# Patient Record
Sex: Male | Born: 1965 | Race: White | Hispanic: No | Marital: Single | Smoking: Former smoker
Health system: Southern US, Community
[De-identification: ages and names within clinical notes are randomized; demographics above are authoritative.]

## PROBLEM LIST (undated history)

## (undated) DIAGNOSIS — J302 Other seasonal allergic rhinitis: Principal | ICD-10-CM

## (undated) DIAGNOSIS — L409 Psoriasis, unspecified: Secondary | ICD-10-CM

## (undated) DIAGNOSIS — I10 Essential (primary) hypertension: Principal | ICD-10-CM

## (undated) DIAGNOSIS — K859 Acute pancreatitis without necrosis or infection, unspecified: Secondary | ICD-10-CM

## (undated) HISTORY — PX: ERCP: SHX60

## (undated) HISTORY — DX: Other seasonal allergic rhinitis: J30.2

## (undated) HISTORY — DX: Essential (primary) hypertension: I10

## (undated) HISTORY — DX: Acute pancreatitis without necrosis or infection, unspecified: K85.90

## (undated) HISTORY — DX: Psoriasis, unspecified: L40.9

---

## 2001-04-10 ENCOUNTER — Encounter (INDEPENDENT_AMBULATORY_CARE_PROVIDER_SITE_OTHER): Payer: Self-pay | Admitting: Specialist

## 2001-04-10 ENCOUNTER — Other Ambulatory Visit: Admission: RE | Admit: 2001-04-10 | Discharge: 2001-04-10 | Payer: Self-pay | Admitting: Urology

## 2002-06-13 ENCOUNTER — Encounter: Payer: Self-pay | Admitting: Family Medicine

## 2002-06-13 ENCOUNTER — Ambulatory Visit (HOSPITAL_COMMUNITY): Admission: RE | Admit: 2002-06-13 | Discharge: 2002-06-13 | Payer: Self-pay | Admitting: Family Medicine

## 2010-08-25 ENCOUNTER — Encounter: Payer: Self-pay | Admitting: Family Medicine

## 2010-08-25 ENCOUNTER — Ambulatory Visit
Admission: RE | Admit: 2010-08-25 | Discharge: 2010-08-25 | Payer: Self-pay | Source: Home / Self Care | Attending: Family Medicine | Admitting: Family Medicine

## 2010-08-25 ENCOUNTER — Other Ambulatory Visit: Payer: Self-pay | Admitting: Family Medicine

## 2010-08-25 DIAGNOSIS — R1011 Right upper quadrant pain: Secondary | ICD-10-CM | POA: Insufficient documentation

## 2010-08-25 DIAGNOSIS — J302 Other seasonal allergic rhinitis: Secondary | ICD-10-CM

## 2010-08-25 DIAGNOSIS — J309 Allergic rhinitis, unspecified: Secondary | ICD-10-CM | POA: Insufficient documentation

## 2010-08-25 HISTORY — DX: Other seasonal allergic rhinitis: J30.2

## 2010-08-25 LAB — CBC WITH DIFFERENTIAL/PLATELET
Basophils Absolute: 0 10*3/uL (ref 0.0–0.1)
Basophils Relative: 0.2 % (ref 0.0–3.0)
Eosinophils Absolute: 0.2 10*3/uL (ref 0.0–0.7)
Eosinophils Relative: 2.2 % (ref 0.0–5.0)
HCT: 43.5 % (ref 39.0–52.0)
Hemoglobin: 14.5 g/dL (ref 13.0–17.0)
Lymphocytes Relative: 6.7 % — ABNORMAL LOW (ref 12.0–46.0)
Lymphs Abs: 0.7 10*3/uL (ref 0.7–4.0)
MCHC: 33.4 g/dL (ref 30.0–36.0)
MCV: 92.8 fl (ref 78.0–100.0)
Monocytes Absolute: 1.1 10*3/uL — ABNORMAL HIGH (ref 0.1–1.0)
Monocytes Relative: 10 % (ref 3.0–12.0)
Neutro Abs: 8.9 10*3/uL — ABNORMAL HIGH (ref 1.4–7.7)
Neutrophils Relative %: 80.9 % — ABNORMAL HIGH (ref 43.0–77.0)
Platelets: 801 10*3/uL — ABNORMAL HIGH (ref 150.0–400.0)
RBC: 4.69 Mil/uL (ref 4.22–5.81)
RDW: 18.2 % — ABNORMAL HIGH (ref 11.5–14.6)
WBC: 11 10*3/uL — ABNORMAL HIGH (ref 4.5–10.5)

## 2010-08-25 LAB — BASIC METABOLIC PANEL
BUN: 20 mg/dL (ref 6–23)
CO2: 36 mEq/L — ABNORMAL HIGH (ref 19–32)
Calcium: 9.7 mg/dL (ref 8.4–10.5)
Chloride: 91 mEq/L — ABNORMAL LOW (ref 96–112)
Creatinine, Ser: 1.3 mg/dL (ref 0.4–1.5)
GFR: 65.23 mL/min (ref >60.00)
Glucose, Bld: 103 mg/dL — ABNORMAL HIGH (ref 70–99)
Potassium: 4.8 mEq/L (ref 3.5–5.1)
Sodium: 139 mEq/L (ref 135–145)

## 2010-08-25 LAB — LIPID PANEL
Cholesterol: 95 mg/dL (ref 0–200)
HDL: 30.3 mg/dL — ABNORMAL LOW (ref >39.00)
LDL Cholesterol: 49 mg/dL (ref 0–99)
Total CHOL/HDL Ratio: 3
Triglycerides: 79 mg/dL (ref 0.0–149.0)
VLDL: 15.8 mg/dL (ref 0.0–40.0)

## 2010-08-25 LAB — PSA: PSA: 1.3 ng/mL (ref 0.10–4.00)

## 2010-08-25 LAB — CONVERTED CEMR LAB
Nitrite: NEGATIVE
Specific Gravity, Urine: 1.02
pH: 6.5

## 2010-08-25 LAB — TSH: TSH: 1.46 u[IU]/mL (ref 0.35–5.50)

## 2010-08-25 LAB — HEPATIC FUNCTION PANEL
ALT: 10 U/L (ref 0–53)
AST: 16 U/L (ref 0–37)
Albumin: 4 g/dL (ref 3.5–5.2)
Alkaline Phosphatase: 55 U/L (ref 39–117)
Bilirubin, Direct: 0.1 mg/dL (ref 0.0–0.3)
Total Bilirubin: 0.3 mg/dL (ref 0.3–1.2)
Total Protein: 7.5 g/dL (ref 6.0–8.3)

## 2010-08-25 LAB — LIPASE: Lipase: 145 U/L — ABNORMAL HIGH (ref 11.0–59.0)

## 2010-08-25 LAB — AMYLASE: Amylase: 185 U/L — ABNORMAL HIGH (ref 27–131)

## 2010-08-26 ENCOUNTER — Telehealth: Payer: Self-pay | Admitting: Family Medicine

## 2010-08-26 NOTE — Telephone Encounter (Signed)
Pt stated that Dr Tawanna Cooler called last night and is returning his call.

## 2010-08-27 ENCOUNTER — Ambulatory Visit: Admission: RE | Admit: 2010-08-27 | Payer: BC Managed Care – PPO | Source: Ambulatory Visit

## 2010-08-27 ENCOUNTER — Other Ambulatory Visit: Payer: Self-pay | Admitting: Family Medicine

## 2010-08-27 ENCOUNTER — Inpatient Hospital Stay
Admission: RE | Admit: 2010-08-27 | Discharge: 2010-08-27 | Disposition: A | Discharge status: Home / Self Care | Payer: BC Managed Care – PPO | Source: Ambulatory Visit | Attending: Family Medicine | Admitting: Family Medicine

## 2010-08-27 ENCOUNTER — Inpatient Hospital Stay (HOSPITAL_COMMUNITY)
Admission: AD | Admit: 2010-08-27 | Discharge: 2010-08-29 | DRG: 204 | Disposition: A | Discharge status: Home / Self Care | Payer: BC Managed Care – PPO | Source: Ambulatory Visit | Attending: Internal Medicine | Admitting: Internal Medicine

## 2010-08-27 ENCOUNTER — Encounter: Payer: Self-pay | Admitting: Gastroenterology

## 2010-08-27 ENCOUNTER — Ambulatory Visit
Admission: RE | Admit: 2010-08-27 | Discharge: 2010-08-27 | Disposition: A | Discharge status: Home / Self Care | Payer: BC Managed Care – PPO | Source: Ambulatory Visit | Attending: Family Medicine | Admitting: Family Medicine

## 2010-08-27 DIAGNOSIS — K8050 Calculus of bile duct without cholangitis or cholecystitis without obstruction: Secondary | ICD-10-CM | POA: Diagnosis present

## 2010-08-27 DIAGNOSIS — R1011 Right upper quadrant pain: Secondary | ICD-10-CM

## 2010-08-27 DIAGNOSIS — R933 Abnormal findings on diagnostic imaging of other parts of digestive tract: Secondary | ICD-10-CM

## 2010-08-27 DIAGNOSIS — K862 Cyst of pancreas: Secondary | ICD-10-CM

## 2010-08-27 DIAGNOSIS — K859 Acute pancreatitis without necrosis or infection, unspecified: Principal | ICD-10-CM | POA: Diagnosis present

## 2010-08-27 LAB — MAGNESIUM: Magnesium: 2.6 mg/dL — ABNORMAL HIGH (ref 1.5–2.5)

## 2010-08-27 LAB — APTT: aPTT: 37 seconds (ref 24–37)

## 2010-08-27 LAB — URINE MICROSCOPIC-ADD ON

## 2010-08-27 LAB — LACTATE DEHYDROGENASE: LDH: 110 U/L (ref 94–250)

## 2010-08-27 LAB — URINALYSIS, ROUTINE W REFLEX MICROSCOPIC
Urine Glucose, Fasting: NEGATIVE mg/dL
pH: 5.5 (ref 5.0–8.0)

## 2010-08-27 MED ORDER — IOHEXOL 300 MG/ML  SOLN: 100.0000 mL | Freq: Once | INTRAMUSCULAR | Status: DC | PRN

## 2010-08-28 ENCOUNTER — Other Ambulatory Visit: Payer: Self-pay | Admitting: Gastroenterology

## 2010-08-28 ENCOUNTER — Ambulatory Visit: Payer: Self-pay | Admitting: Family Medicine

## 2010-08-28 ENCOUNTER — Inpatient Hospital Stay (HOSPITAL_COMMUNITY): Payer: BC Managed Care – PPO

## 2010-08-28 DIAGNOSIS — K831 Obstruction of bile duct: Secondary | ICD-10-CM

## 2010-08-28 LAB — DIFFERENTIAL
Lymphocytes Relative: 4 % — ABNORMAL LOW (ref 12–46)
Lymphs Abs: 0.6 10*3/uL — ABNORMAL LOW (ref 0.7–4.0)
Monocytes Relative: 13 % — ABNORMAL HIGH (ref 3–12)
Neutro Abs: 10.8 10*3/uL — ABNORMAL HIGH (ref 1.7–7.7)
Neutrophils Relative %: 82 % — ABNORMAL HIGH (ref 43–77)

## 2010-08-28 LAB — COMPREHENSIVE METABOLIC PANEL
AST: 13 U/L (ref 0–37)
Albumin: 2.9 g/dL — ABNORMAL LOW (ref 3.5–5.2)
Alkaline Phosphatase: 40 U/L (ref 39–117)
BUN: 22 mg/dL (ref 6–23)
CO2: 30 mEq/L (ref 19–32)
Chloride: 92 mEq/L — ABNORMAL LOW (ref 96–112)
Creatinine, Ser: 1.27 mg/dL (ref 0.4–1.5)
GFR calc non Af Amer: >60 mL/min (ref >60)
Potassium: 3.5 mEq/L (ref 3.5–5.1)
Total Bilirubin: 1 mg/dL (ref 0.3–1.2)

## 2010-08-28 LAB — CBC
Hemoglobin: 11.5 g/dL — ABNORMAL LOW (ref 13.0–17.0)
MCH: 29.7 pg (ref 26.0–34.0)
MCV: 89.1 fL (ref 78.0–100.0)
RBC: 3.87 MIL/uL — ABNORMAL LOW (ref 4.22–5.81)
WBC: 13.2 10*3/uL — ABNORMAL HIGH (ref 4.0–10.5)

## 2010-08-28 LAB — CANCER ANTIGEN 19-9: CA 19-9: 34.3 U/mL — ABNORMAL LOW (ref <35.0)

## 2010-08-31 ENCOUNTER — Telehealth: Payer: Self-pay | Admitting: Gastroenterology

## 2010-09-01 ENCOUNTER — Encounter (INDEPENDENT_AMBULATORY_CARE_PROVIDER_SITE_OTHER): Payer: Self-pay | Admitting: *Deleted

## 2010-09-01 ENCOUNTER — Telehealth (INDEPENDENT_AMBULATORY_CARE_PROVIDER_SITE_OTHER): Payer: Self-pay | Admitting: *Deleted

## 2010-09-01 DIAGNOSIS — R933 Abnormal findings on diagnostic imaging of other parts of digestive tract: Secondary | ICD-10-CM | POA: Insufficient documentation

## 2010-09-02 NOTE — Procedures (Signed)
Summary: ERCP  Patient: James Giles Note: All result statuses are Final unless otherwise noted.  Tests: (1) ERCP (ERC)   ERC ERCP                  DONE     [facility]     [address] [place], [place]  [address]           ERCP PROCEDURE REPORT           PATIENT:  James, Giles  MR#:  [id number]     BIRTHDATE:  1965/10/28  GENDER:  male           ENDOSCOPIST:  James Hair. Arlyce Dice, MD     ASSISTANT:           PROCEDURE DATE:  08/28/2010     PROCEDURE:  ERCP with biopsy, ERCP with sphincterotomy, ERCP with     stent placement           INDICATIONS:  abnormal imaging           MEDICATIONS:   MAC sedation, administered by CRNA     TOPICAL ANESTHETIC:  Cetacaine Spray           DESCRIPTION OF PROCEDURE:   After the risks benefits and     alternatives of the procedure were thoroughly explained, informed     consent was obtained.  The  endoscope was introduced through the     mouth and advanced to the third portion of the duodenum.           Thick folds were found in the bulb and the descending duodenum.     Markedly thickened, inflamed folds and mucosa at apex of duodenal     bulb extending to 2nd portion, causing partial obstruction. Scope     passed with mild difficulty. Biopsies were taken (see image2).  A     stricture was found. Selective cannulation of the pancreatic duct     demonstrated a 3cm stricture in the head of the pancrease     extending to the papilla. Cytology was taken.     Selective cannulation of the CBD demonstrated a 3cm stricture of     the distal duct with mild ductal dilitation proximal to stricture.     Initially it was thought that there was a 3-56mm filling defect but     no stones were delivered into the duodenum after sweeping the duct     with a 12mm balloon stone extractor multiple times.     12mm sphincterotomy was made. A 10Fr 4cm plastic biliary stent was     placed through the stricture after cytology of the CBD stricture   was done (see image3).    The scope was then completely withdrawn     from the patient and the procedure terminated.  45cc contrast dye     was injected.     <<PROCEDUREIMAGES>>           COMPLICATIONS:  None           ENDOSCOPIC IMPRESSION:1) strictures of CBD and pancreatic duct -     r/o pancreatic Ca     2) marked     inflammation of the duodenum - r/o infiltrating Ca           Recommendations: 1) await cytology and biopsy     2) EUS if #1 is not diagnostic  ______________________________     James Hair Arlyce Dice, MD           CC:  James Pee, MD           n.     James Giles:   James Giles at 08/28/2010 09:11 AM           James Giles, [id number]  Note: An exclamation mark (!) indicates a result that was not dispersed into the flowsheet. Document Creation Date: 08/28/2010 9:12 AM _______________________________________________________________________  (1) Order result status: Final Collection or observation date-time: 08/28/2010 08:58 Requested date-time:  Receipt date-time:  Reported date-time:  Referring Physician:   Ordering Physician: James Giles [id number]) Specimen Source:  Source: James Giles Order Number: [id number] Lab site:

## 2010-09-02 NOTE — Letter (Signed)
Summary: New Patient letter  [facility]  [address] [place], [place] [address]   Phone: [phone]  Fax: [phone]       08/27/2010 MRN: [id number]  James Giles [address] [place], [place]  [address]  Dear [name],  Welcome to the Gastroenterology Division at [facility].    You are scheduled to see Dr.  Russella Dar on 09-03-10 at 10:45am on the 3rd floor at [facility], [address]  We ask that you try to arrive at our office 15 minutes prior to your appointment time to allow for check-in.  We would like you to complete the enclosed self-administered evaluation form prior to your visit and bring it with you on the day of your appointment.  We will review it with you.  Also, please bring a complete list of all your medications or, if you prefer, bring the medication bottles and we will list them.  Please bring your insurance card so that we may make a copy of it.  If your insurance requires a referral to see a specialist, please bring your referral form from your primary care physician.  Co-payments are due at the time of your visit and may be paid by cash, check or credit card.     Your office visit will consist of a consult with your physician (includes a physical exam), any laboratory testing he/she may order, scheduling of any necessary diagnostic testing (e.g. x-ray, ultrasound, CT-scan), and scheduling of a procedure (e.g. Endoscopy, Colonoscopy) if required.  Please allow enough time on your schedule to allow for any/all of these possibilities.    If you cannot keep your appointment, please call [phone] to cancel or reschedule prior to your appointment date.  This allows [place] the opportunity to schedule an appointment for another patient in need of care.  If you do not cancel or reschedule by 5 p.m. the business day prior to your appointment date, you will be charged a $50.00 late cancellation/no-show fee.    Thank you for choosing  [facility] for your medical needs.  We appreciate the opportunity to care for you.  Please visit [place] at our website  to learn more about our practice.                     Sincerely,                                                             The Gastroenterology Division

## 2010-09-02 NOTE — Assessment & Plan Note (Signed)
Summary: BRAND NEW PT/TO EST/PT REQ CPX/PT FASTING/CJR   Vital Signs:  Patient profile:   45 year old male Height:      70.5 inches Weight:      145 pounds BMI:     20.59 Temp:     97.4 degrees F oral BP sitting:   102 / 78  (left arm) Cuff size:   regular  Vitals Entered By: Kern Reap CMA Duncan Dull) (August 25, 2010 8:43 AM) CC: new to establish, weigh loss, abd pain Is Patient Diabetic? No Pain Assessment Patient in pain? no        CC:  new to establish, weigh loss, and abd pain.  History of Present Illness: James Giles is a 45 year old, divorced male, nonsmoker, who comes in today as a new patient for general physical examination and his major concern is recurrent right lower quadrant abdominal pain.  He states this is the fourth episode in the last 4 months for his had this problem.  It typically comes in presents with mid low back pain that radiates to the right upper  quadrant.  He describes the pain as sharp, sometimes, dull, it's constant will last for a day or two or 3 and then go away, and the severity scale of 6 on a scale of one to 10.  He has nausea, vomiting, fever, and chills.  His older brother was recently diagnosed with left-sided diverticulitis.no family history of gallbladder disease he's lost 20 pounds in the last 6 months Review of systems otherwise negative.  He went to a urgent care clinic.  This summer and was found to have a UTI.  They did an H. pylori.  It was negative.  Family history father has prostate cancer, diagnosed at age 41 brother with diverticulitis.  Mother with hypertension.  He is unsure of his last tetanus booster.  Preventive Screening-Counseling & Management  Alcohol-Tobacco     Smoking Status: never  Caffeine-Diet-Exercise     Does Patient Exercise: yes      Drug Use:  no.    Allergies (verified): No Known Drug Allergies  Past History:  Past medical, surgical, family and social histories (including risk factors) reviewed, and no  changes noted (except as noted below).  Past Medical History: Allergic rhinitis psoriasis  Past Surgical History: Denies surgical history  Family History: Reviewed history and no changes required. Father: healthy...prost ca @ age 34 Mother: HTN Siblings: 2 brothers healthy Children: 3 sons  Social History: Reviewed history and no changes required. Occupation: Conservation officer, [employer] Divorced Never Smoked Alcohol use-yes Drug use-no Regular exercise-yes Smoking Status:  never Drug Use:  no Does Patient Exercise:  yes  Review of Systems      See HPI  Physical Exam  General:  Well-developed,well-nourished,in no acute distress; alert,appropriate and cooperative throughout examination Head:  Normocephalic and atraumatic without obvious abnormalities. No apparent alopecia or balding. Eyes:  No corneal or conjunctival inflammation noted. EOMI. Perrla. Funduscopic exam benign, without hemorrhages, exudates or papilledema. Vision grossly normal. Ears:  External ear exam shows no significant lesions or deformities.  Otoscopic examination reveals clear canals, tympanic membranes are intact bilaterally without bulging, retraction, inflammation or discharge. Hearing is grossly normal bilaterally. Nose:  External nasal examination shows no deformity or inflammation. Nasal mucosa are pink and moist without lesions or exudates. Mouth:  Oral mucosa and oropharynx without lesions or exudates.  Teeth in good repair. Neck:  No deformities, masses, or tenderness noted. Chest Wall:  No deformities, masses, tenderness or gynecomastia noted.  Breasts:  No masses or gynecomastia noted Lungs:  Normal respiratory effort, chest expands symmetrically. Lungs are clear to auscultation, no crackles or wheezes. Heart:  Normal rate and regular rhythm. S1 and S2 normal without gallop, murmur, click, rub or other extra sounds. Abdomen:  Bowel sounds positive,abdomen soft and non-tender without masses,  organomegaly or hernias noted. Rectal:  No external abnormalities noted. Normal sphincter tone. No rectal masses or tenderness. Genitalia:  Testes bilaterally descended without nodularity, tenderness or masses. No scrotal masses or lesions. No penis lesions or urethral discharge. Prostate:  Prostate gland firm and smooth, no enlargement, nodularity, tenderness, mass, asymmetry or induration. Msk:  No deformity or scoliosis noted of thoracic or lumbar spine.   Pulses:  R and L carotid,radial,femoral,dorsalis pedis and posterior tibial pulses are full and equal bilaterally Extremities:  No clubbing, cyanosis, edema, or deformity noted with normal full range of motion of all joints.   Neurologic:  No cranial nerve deficits noted. Station and gait are normal. Plantar reflexes are down-going bilaterally. DTRs are symmetrical throughout. Sensory, motor and coordinative functions appear intact. Skin:  total body skin exam normal except for some very mild psoriasis Cervical Nodes:  No lymphadenopathy noted Axillary Nodes:  No palpable lymphadenopathy Inguinal Nodes:  No significant adenopathy Psych:  Cognition and judgment appear intact. Alert and cooperative with normal attention span and concentration. No apparent delusions, illusions, hallucinations   Problems:  Medical Problems Added: 1)  Dx of Abdominal Pain, Right Upper Quadrant  (ICD-789.01) 2)  Dx of Allergic Rhinitis  (ICD-477.9)  Impression & Recommendations:  Problem # 1:  ABDOMINAL PAIN, RIGHT UPPER QUADRANT (ICD-789.01) Assessment New  Orders: Venipuncture (04540) TLB-Lipid Panel (80061-LIPID) TLB-BMP (Basic Metabolic Panel-BMET) (80048-METABOL) TLB-CBC Platelet - w/Differential (85025-CBCD) TLB-Hepatic/Liver Function Pnl (80076-HEPATIC) TLB-TSH (Thyroid Stimulating Hormone) (84443-TSH) TLB-Amylase (82150-AMYL) TLB-Lipase (83690-LIPASE) TLB-PSA (Prostate Specific Antigen) (84153-PSA) Prescription Created Electronically  (J8119) UA Dipstick w/o Micro (automated)  (81003) Gastroenterology Referral (GI) Radiology Referral (Radiology) Specimen Handling (14782) EKG w/ Interpretation (93000)  Problem # 2:  Preventive Health Care (ICD-V70.0) Assessment: New  Other Orders: Tdap => 58yrs IM ([address]) Admin 1st Vaccine (30865)  Patient Instructions: 1)  stay on a complete fat free diet  2)  We will get you set up for an ultrasound of her gallbladder returned the day after study for follow-up.  We will also get you set up for a GI consult   Orders Added: 1)  Venipuncture [36415] 2)  TLB-Lipid Panel [80061-LIPID] 3)  TLB-BMP (Basic Metabolic Panel-BMET) [80048-METABOL] 4)  TLB-CBC Platelet - w/Differential [85025-CBCD] 5)  TLB-Hepatic/Liver Function Pnl [80076-HEPATIC] 6)  TLB-TSH (Thyroid Stimulating Hormone) [84443-TSH] 7)  TLB-Amylase [82150-AMYL] 8)  TLB-Lipase [83690-LIPASE] 9)  TLB-PSA (Prostate Specific Antigen) [78469-GEX] 10)  Prescription Created Electronically [G8553] 11)  New Patient 40-64 years [99386] 12)  UA Dipstick w/o Micro (automated)  [81003] 13)  Gastroenterology Referral [GI] 14)  Radiology Referral [Radiology] 15)  Specimen Handling [99000] 16)  Tdap => 53yrs IM [90715] 17)  Admin 1st Vaccine [90471] 18)  EKG w/ Interpretation [93000]   Immunizations Administered:  Tetanus Vaccine:    Vaccine Type: Tdap    Site: right deltoid    Mfr: GlaxoSmithKline    Dose: 0.5 ml    Route: IM    Given by: Kern Reap CMA (AAMA)    Exp. Date: 05/14/2012    Lot #: [id number]    VIS given: 06/12/08 version given August 25, 2010.    Physician counseled: yes   Immunizations Administered:  Tetanus Vaccine:    Vaccine Type: Tdap    Site: right deltoid    Mfr: GlaxoSmithKline    Dose: 0.5 ml    Route: IM    Given by: Kern Reap CMA (AAMA)    Exp. Date: 05/14/2012    Lot #: [id number]    VIS given: 06/12/08 version given August 25, 2010.    Physician counseled:  yes  Laboratory Results   Urine Tests    Routine Urinalysis   Color: yellow Appearance: Clear Glucose: negative   (Normal Range: Negative) Bilirubin: 1+   (Normal Range: Negative) Ketone: 1+   (Normal Range: Negative) Spec. Gravity: 1.020   (Normal Range: 1.003-1.035) Blood: trace-lysed   (Normal Range: Negative) pH: 6.5   (Normal Range: 5.0-8.0) Protein: 1+   (Normal Range: Negative) Urobilinogen: 0.2   (Normal Range: 0-1) Nitrite: negative   (Normal Range: Negative) Leukocyte Esterace: negative   (Normal Range: Negative)    Comments: Rita Ohara  August 25, 2010 10:37 AM

## 2010-09-03 ENCOUNTER — Ambulatory Visit: Payer: BC Managed Care – PPO | Admitting: Gastroenterology

## 2010-09-03 LAB — CULTURE, BLOOD (ROUTINE X 2)
Culture  Setup Time: 201202031214
Culture: NO GROWTH
Culture: NO GROWTH

## 2010-09-07 NOTE — H&P (Signed)
James Giles, James Giles              ACCOUNT NO.:  [id number]  MEDICAL RECORD NO.:  [id number]           PATIENT TYPE:  I  LOCATION:  1532                         FACILITY:  [facility]  PHYSICIAN:  Homero Fellers, MD   DATE OF BIRTH:  10/06/1965  DATE OF ADMISSION:  08/27/2010 DATE OF DISCHARGE:                             HISTORY & PHYSICAL   PRIMARY CARE PHYSICIAN:  [name], MD  CHIEF COMPLAINT:  Right upper quadrant abdominal pain, nausea and vomiting.  HISTORY OF PRESENT ILLNESS:  This is a pleasant 45 year old gentleman who presented to his primary care physician's office 2 days ago with recurrent abdominal pain, nausea, and vomiting which has actually been going on for the past 2 months (Tums given).  The patient did not seek any medical care until recently because of persistent symptoms.  He typically described an episode lasting 2-3 days consisting of right upper quadrant abdominal pain radiating to his back, accompanied with nausea, vomiting, and poor appetite.  He has had about 4 episodes since the problem started 2 months ago.  The patient also lost about 20 pounds of weight since November last year.  He used to drink on a fairly daily basis until this past fall when he stopped drinking altogether.  He denies any fever, chest pain, cough, diarrhea, constipation, headaches, urinary symptoms, palpitation, or leg swelling.  He has not had any surgery before involving the gallbladder or gallstones.  He has not been diagnosed with gallstones before.  He has a CAT scan today showed significant enlargement of the pancreatic head with adjacent pancreatic exudate consistent with acute pancreatitis.  There is also an apparent ductal calculus near the ampulla which may be creating acute pancreatitis.  Pancreatic tumor cannot be excluded.  There is also description of a nonocclusive clots in the superior mesenteric vein and evidence of pseudocyst formation.  The patient at  this time does not have any abdominal pain even though he had abdominal pain 2 days ago with vomiting and nausea.  The last time he vomited was yesterday which occurred twice, one consisted of recently ingested feed and the other was bilious.  PAST MEDICAL HISTORY:  History of allergic rhinitis and cirrhosis.  MEDICATION:  Allergy medication of needed.  ALLERGIES:  None.  SOCIAL HISTORY:  No smoking or drug use.  The patient quit drinking this past fall.  The patient is divorced, has 3 boys, and currently lives alone.  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  FAMILY HISTORY:  Grandfather had history of gallbladder surgery and gallbladder disease in his mid 20s.  Grandfather also had history of prostate cancer.  His father also had history of prostate cancer. Mother had no significant medical problems.  REVIEW OF SYSTEMS:  Ten-point review of systems is essentially negative except as described above.  PHYSICAL EXAMINATION:  VITAL SIGNS:  Blood pressure is 117/81, pulse 114, respiration 20, temperature 99.3, pulse oximetry 99% on room air. GENERAL:  The patient is seated at the bedside, appeared comfortable, in no distress. HEENT:  PERRLA.  Mouth, slightly dry. NECK:  Supple.  No JVD,  adenopathy, or thyromegaly. LUNGS:  Clear to auscultation bilaterally with no wheezing, no crackles. HEART:  S1, S2, regular rate and rhythm.  No murmurs, rubs, or gallop. ABDOMEN:  Flat, soft, nontender.  Bowel sound is present.  No masses. Murphy's sign is negative.  There is no rebound tenderness and no evidence of ascites. EXTREMITIES:  No edema, clubbing, or cyanosis. NEUROLOGIC:  No focal deficits.  Coordination is normal.  Speech is clear.  LABORATORY DATA:  The patient's white count is slightly elevated at 11,000 with slight left shift.  Hemoglobin 14.5, platelet count is 801,000 which is almost twice upper limit of normal.  Chemistry, sodium is 139,  potassium 4.8, BUN 20, creatinine 1.3, calcium 9.7.  Lipid profile done few days back showed triglyceride of 79 and total cholesterol of 95.  I am not sure this is the fasting sample.  Hepatic panel showed normal liver enzymes.  TSH is normal at 1.46.  Lipase is 145, which is about 3 times upper limit of normal.  Amylase is also modestly elevated at 185.  PSA is normal at 1.3.  He had an abdominal ultrasound done also today, which showed no gallstone but minimal gallbladder sludge with evidence of prominent pancreatic duct and slight prominence of the common bile duct and intrahepatic ducts, finding suggesting the calculus, stricture, or mass.  CT of the abdomen was also done which showed finding described above in the body of the history. EKG done 2 days ago showed normal sinus rhythm with no evidence of ischemia.  ASSESSMENT:  Pleasant 45 year old gentleman admitted with: 1. Pancreatitis which going by history is likely recurrent, low grade,     without evidence of any complication.  Going by advancing criteria,     this is not severe pancreatitis, but presence of leukocytosis,     acidosis, and clot in the superior mesenteric vein is quite     concerning. 2. Choledocholithiasis. 3. 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 4. Mild leukocytosis.  PLAN:  Keep on telemetry and keep on clear liquids except vomiting becomes worrisome.  I will give him IV fluids, normal saline at 200 mL per hour for the next 24 hours and then bring it to 150 mL per hour. Follow electrolytes in clinical condition.  Repeat lipase in the morning.  Check magnesium level.  Check 2 set of blood cultures.  We will try empiric antibiotics.  Gastroenterology has seen the patient and planning for ERCP tomorrow.  The patient will also probably need laparoscopic cholecystectomy either on this admission alias on outpatient when is better optimized.  For his superior mesenteric vein clots, we would not  offer anticoagulation at this time since he has been planned for ERCP tomorrow.  He may actually not need any anticoagulation since this clot is nonocclusive and there is a tendency for the clots to resolve with treatment and supportive care.  I will defer further care to gastroenterology.  Overall condition is stable.  For now, he will be on DVT prophylaxis.  I will also request an LDH level.  55 minutes.     Homero Fellers, MD     FA/MEDQ  D:  08/27/2010  T:  08/27/2010  Job:  [id number]  Electronically Signed by Homero Fellers  on 08/31/2010 10:31:10 PM

## 2010-09-10 NOTE — Letter (Signed)
Summary: EGD Instructions  [facility]  [address] [place], [place] [address]   Phone: [phone]  Fax: [phone]       James Giles    [birth year]    MRN: [id number]       Procedure Day /Date:09/24/10 [name]     Arrival Time: 8 am     Procedure Time:10 am     Location of Procedure:                     [facility] ( Outpatient Registration)    PREPARATION FOR ENDOSCOPY   On 09/24/10  THE DAY OF THE PROCEDURE:  1.   Nothing to eat or drink after midnight   MEDICATION INSTRUCTIONS  Unless otherwise instructed, you should take regular prescription medications with a small sip of water as early as possible the morning of your procedure.             OTHER INSTRUCTIONS  You will need a responsible adult at least 45 years of age to accompany you and drive you home.   This person must remain in the waiting room during your procedure.  Wear loose fitting clothing that is easily removed.  Leave jewelry and other valuables at home.  However, you may wish to bring a book to read or an iPod/MP3 player to listen to music as you wait for your procedure to start.  Remove all body piercing jewelry and leave at home.  Total time from sign-in until discharge is approximately 2-3 hours.  You should go home directly after your procedure and rest.  You can resume normal activities the day after your procedure.  The day of your procedure you should not:   Drive   Make legal decisions   Operate machinery   Drink alcohol   Return to work  You will receive specific instructions about eating, activities and medications before you leave.    The above instructions have been reviewed and explained to me by   Chales Abrahams CMA Duncan Dull)  September 01, 2010 3:49 PM     I fully understand and can verbalize these instructions over the phone mailed to home Date 09/01/10

## 2010-09-10 NOTE — Progress Notes (Addendum)
Summary: Doc Change  ---- Converted from flag ---- ---- 09/01/2010 3:35 PM, Louis Meckel MD wrote: Yes.  ---- 09/01/2010 2:39 PM, Chales Abrahams CMA Duncan Dull) wrote: Dr Arlyce Dice this pt has new pt appt to see Dr Russella Dar on Thurs.  Should this be cx?  I am getting the EUS scheduled. ------------------------------  Appended Document: Doc Change I spoke with Dr. Donell Beers today after gi tumor board.  his case was discussed.    Would like him tohave repeat CT scan (pancreatic protocol with IV and oral contrast early next week, this will given information that will complement upcoming EUS).  Patty, can you set this up and also ask how he has been feeling since d/c from hosp.  Appended Document: Doc Change left message on machine to call back   Appended Document: Orders Update/CT pt states he is feeling well, he has only had a couple of episodes of right side pain.  He says it causes him to loose his appetite for a few days then his appetite will return.  Ct Scan scheduled for Monday 09/21/10. Pt aware that the appt is scheduled and he has been instructed  Clinical Lists Changes  Orders: Added new Test order of GI Misc Procedure/ Radiology Order (GI Misc ) - Signed

## 2010-09-10 NOTE — Progress Notes (Signed)
Summary: Results  Phone Note Call from Patient Call back at Home Phone [phone]   Caller: Patient Call For: Dr. Arlyce Dice Reason for Call: Talk to Nurse Summary of Call: Pt calling for biopsy results Initial call taken by: [place] Johnson,  August 31, 2010 3:17 PM  Follow-up for Phone Call        Called patient back and let him know the path report is not back yet. Let patient know I will call him as soon as I get results from Dr. Arlyce Dice. Follow-up by: Selinda Michaels RN,  August 31, 2010 3:26 PM  Additional Follow-up for Phone Call Additional follow up Details #1::        Per Dr.Ruxin Ransome path report was benign. Patient notified. EUS to be scheduled asap with Dr. Christella Hartigan, information sent to Dr. Christella Hartigan. Patient informed we will call him as soon as EUS scheduled. Additional Follow-up by: Selinda Michaels RN,  August 31, 2010 4:03 PM     Appended Document: Results patty, he needs upper EUS (radial +/- linear) with propofol sedation, next available EUS thursday.  60 min for abnormal pancreas, CBD  Appended Document: Orders Update/EUS pt is aware and instructed meds reviewed   Clinical Lists Changes  Problems: Added new problem of NONSPECIFIC ABN FINDING RAD & OTH EXAM GI TRACT (ICD-793.4) - Signed Orders: Added new Test order of EUS-Upper (EUS-Upper) - Signed

## 2010-09-17 ENCOUNTER — Other Ambulatory Visit: Payer: Self-pay | Admitting: Gastroenterology

## 2010-09-17 DIAGNOSIS — K8689 Other specified diseases of pancreas: Secondary | ICD-10-CM

## 2010-09-18 ENCOUNTER — Encounter: Payer: Self-pay | Admitting: Gastroenterology

## 2010-09-21 ENCOUNTER — Ambulatory Visit (INDEPENDENT_AMBULATORY_CARE_PROVIDER_SITE_OTHER)
Admission: RE | Admit: 2010-09-21 | Discharge: 2010-09-21 | Disposition: A | Discharge status: Home / Self Care | Payer: BC Managed Care – PPO | Source: Ambulatory Visit | Attending: Gastroenterology | Admitting: Gastroenterology

## 2010-09-21 DIAGNOSIS — K8689 Other specified diseases of pancreas: Secondary | ICD-10-CM

## 2010-09-21 DIAGNOSIS — R109 Unspecified abdominal pain: Secondary | ICD-10-CM

## 2010-09-21 MED ORDER — IOHEXOL 300 MG/ML  SOLN
100.0000 mL | Freq: Once | INTRAMUSCULAR | Status: AC | PRN
  Administered 2010-09-21: 100 mL via INTRAVENOUS

## 2010-09-22 NOTE — Miscellaneous (Signed)
Summary: Orders Update/CT  Clinical Lists Changes  Orders: Added new Test order of CT Abdomen/Pelvis w & w/o Contrast (CT A/P w&w/o Cont) - Signed

## 2010-09-24 ENCOUNTER — Encounter: Payer: BC Managed Care – PPO | Admitting: Gastroenterology

## 2010-09-24 ENCOUNTER — Other Ambulatory Visit: Payer: Self-pay | Admitting: Gastroenterology

## 2010-09-24 ENCOUNTER — Ambulatory Visit (HOSPITAL_COMMUNITY)
Admission: RE | Admit: 2010-09-24 | Discharge: 2010-09-24 | Disposition: A | Discharge status: Home / Self Care | Payer: BC Managed Care – PPO | Source: Ambulatory Visit | Attending: Gastroenterology | Admitting: Gastroenterology

## 2010-09-24 ENCOUNTER — Encounter: Payer: Self-pay | Admitting: Gastroenterology

## 2010-09-24 DIAGNOSIS — R109 Unspecified abdominal pain: Secondary | ICD-10-CM

## 2010-09-24 DIAGNOSIS — R933 Abnormal findings on diagnostic imaging of other parts of digestive tract: Secondary | ICD-10-CM

## 2010-10-01 LAB — MISCELLANEOUS TEST

## 2010-10-01 NOTE — Procedures (Signed)
Summary: Endoscopic Ultrasound  Patient: James Giles Note: All result statuses are Final unless otherwise noted.  Tests: (1) Endoscopic Ultrasound (EUS)  EUS Endoscopic Ultrasound                             DONE     [facility]     [address] [place], [place]  [address]           ENDOSCOPIC ULTRASOUND PROCEDURE REPORT           PATIENT:  James, Giles  MR#:  [id number]     BIRTHDATE:  Oct 19, 1965  GENDER:  male     ENDOSCOPIST:  Rachael Fee, MD     REFERRED BY:  Teena Irani. Arlyce Dice, M.D.     PROCEDURE DATE:  09/24/2010     PROCEDURE:  Upper EUS w/FNA     ASA CLASS:  Class II     INDICATIONS:  recent illness: 4 month weight loss (stablized most     recently), abd pain.  CT scan 1 month ago suggested very abnormal     pancreatic head, PD (inflammatory?, neoplastic) and abnormal     duodenum, normal LFTs, elevated pancreatic enzymes, normal CEA,     normal CA 19-9; underwent ERCP brushing, biliary stent placment.     MEDICATIONS:  MAC sedation, administered by CRNA; cirpo 400mg  IV           DESCRIPTION OF PROCEDURE:   After the risks benefits and     alternatives of the procedure were  explained, informed consent     was obtained. The patient was then placed in the left, lateral,     decubitus postion and IV sedation was administered. Throughout the     procedure, the patient's blood pressure, pulse and oxygen     saturations were monitored continuously.  Under direct     visualization, the  endoscope was introduced through the mouth and     advanced to the second portion of the duodenum.  Water was used as     necessary to provide an acoustic interface.  Upon completion of     the imaging, water was removed and the patient was sent to the     recovery room in satisfactory condition.     <<PROCEDUREIMAGES>>     Endoscopic findings (with standard adult gastroscope, side viewing     duodenoscope, radial and linear echoendoscopes):     1. Normal esophagus  and stomach     2. Significantly inflammed, edematous duodenum from bulb to major     papilla.  There was minor narrowing at apex of bulb that was     easily transversed. The previously placed biliary stent was in     place.  Several mucosal biopsies taken from duodenum.           EUS findings:     1. Pancreatic parenchyma was abnormal throughout the gland.  There     was a honeycomb pattern to neck, body and tail. The head of     pancreas was signficantly more hypoechoic than usual with     amorphous borders but no clear, discrete mass.  This abnormal head     of pancreas was sampled with 2 passes of 22 guage BS EUS FNA     needle.     2. Main pancreatic duct was ectatic, slightly dilated in neck,  body and tail (6mm in neck, 4mm in body). There were some     hyperechoic duct borders.     3. Peripancreatic fluid collection, surrounded by hypoechoic soft     tissue (edema). This measured 1.7cm maximally and was aspirated     with single pass of 19 gauge BS EUS FNA needle. This aspirate was     necrotic, bloody appearing without clear purulence.     4. Previously placed biliary stent in CBD.     5. Several small air bubbles in gallbladder, no clear stones.     6. Limited views of liver, spleen, portal and splenic vessels were     all normal.           Impression:     I am most suspicious of an inflammatory process. Pancreatic     parenchyma and main pancreatic duct showed signs of chronic     pancreatitis.   Pancreatic head was very abnormal, sampled with     FNA (sent to check for neoplasm, IgG4 staining).  Peripancreatic     fluid collection appeared necrotic with old blood, sent for     cytology as well. Peripancreatic duodenum was also very abnormal,     biopsied mucosally again.  Await all these biopsy results.  Also     sent blood test for serum IgG4 (autoimmune pancreatitis markers).     He will start once daily PPI (OTC prilosec for now).  Also will     complete 3 days of  twice daily cipro.           ______________________________     Rachael Fee, MD           CC:           n.     eSIGNED:   Rachael Fee at 09/24/2010 11:23 AM           Eula Fried, [id number]  Note: An exclamation mark (!) indicates a result that was not dispersed into the flowsheet. Document Creation Date: 09/24/2010 11:24 AM _______________________________________________________________________  (1) Order result status: Final Collection or observation date-time: 09/24/2010 10:53 Requested date-time:  Receipt date-time:  Reported date-time:  Referring Physician:   Ordering Physician: Rob Bunting [phone]) Specimen Source:  Source: Launa Grill Order Number: [id number] Lab site:

## 2010-10-01 NOTE — Miscellaneous (Signed)
Summary: rx  Clinical Lists Changes  Medications: Added new medication of CIPROFLOXACIN HCL 500 MG  TABS (CIPROFLOXACIN HCL) Take 1 twice a dayfor 3 days - Signed Rx of CIPROFLOXACIN HCL 500 MG  TABS (CIPROFLOXACIN HCL) Take 1 twice a dayfor 3 days;  #6 x 0;  Signed;  Entered by: Rachael Fee MD;  Authorized by: Rachael Fee MD;  Method used: Print then Give to Patient    Prescriptions: CIPROFLOXACIN HCL 500 MG  TABS (CIPROFLOXACIN HCL) Take 1 twice a dayfor 3 days  #6 x 0   Entered and Authorized by:   Rachael Fee MD   Signed by:   Rachael Fee MD on 09/24/2010   Method used:   Print then Give to Patient   RxID:   [id number]

## 2010-10-05 ENCOUNTER — Telehealth: Payer: Self-pay | Admitting: Gastroenterology

## 2010-10-09 NOTE — Discharge Summary (Signed)
  James Giles, James Giles              ACCOUNT NO.:  [id number]  MEDICAL RECORD NO.:  [id number]           PATIENT TYPE:  I  LOCATION:  1432                         FACILITY:  [facility]  PHYSICIAN:  Zannie Cove, MD     DATE OF BIRTH:  [birth year]  DATE OF ADMISSION:  08/27/2010 DATE OF DISCHARGE:  08/30/2010                        DISCHARGE SUMMARY - REFERRING   DISCHARGE DIAGNOSES: 1. Pancreatic and CBD strictures, status post ERCP biopsy and stent     pathology pending. 2. 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 3. Reactive leukocytosis.  CONSULTANTS:  Dr. Melvia Heaps, [facility].  PROCEDURES:  ERCP with stent placement and biopsy of the CBD  DIAGNOSTICS INVESTIGATIONS:  Abdominal ultrasound February 22, prominent pancreatic duct, prominence of the CBD, and intrahepatic duct, findings suggestive of distal CBD abnormalities, suggest calculus, stricture, and mass.  CT abdomen and pelvis February 02 showed significant enlargement of  pancreatic head with low attenuation structures and there is pancreatic exudate most consistent with changes of acute pancreatitis. Apparent ductal calculus near the ampulla may be due to pancreatitis. However, pancreatic tumor cannot be excluded, nonocclusive clot in the mesenteric vein.  HOSPITAL COURSE:  The patient is a 45 year old gentleman with no significant past medical history, presented to the hospital with nausea, vomiting, abdominal pain, and weight loss which had been going on for approximately 2 months in duration.  On further evaluation, he was found to have mild pancreatitis with CBD as well as pancreatic duct dilatation.  On further evaluation, he was seen by GI, had a CAT scan and abdominal ultrasound with results as dictated above.  He underwent ERCP with stent placement and biopsy.  He also had tumor markers obtained which were negative.  However, the suspicion is probably inflammatory versus neoplastic.  The patient  will follow up with Dr. Arlyce Dice and if the pathology is negative, he will need an EUS and biopsy.  DISCHARGE CONDITION:  Stable.  PHYSICAL EXAMINATION:  Vital Signs:  Temp is 98; pulse 85, blood pressure 109/60, respirations 16, satting 95% on room air.  Discharge follow-up with Dr. Arlyce Dice.  His office will call when biopsy results available.     Zannie Cove, MD     PJ/MEDQ  D:  10/08/2010  T:  10/08/2010  Job:  [id number]  Electronically Signed by Zannie Cove  on 10/09/2010 11:21:40 AM

## 2010-10-13 NOTE — Progress Notes (Signed)
Summary: triage  Phone Note Call from Patient Call back at Home Phone [phone]   Caller: Patient Call For: Dr. Arlyce Dice (out of town) Reason for Call: Talk to Nurse Summary of Call: Patient had a Pancreatitis flare up this weekend, wants to speak to Dr Christella Hartigan Initial call taken by: Tawni Levy,  October 05, 2010 2:01 PM  Follow-up for Phone Call        pt had possible flare of pancreatitis on Fri and Sat, had lower back pain, fever, chills, no appetite for a whole day with nausea, abd felt like it was having spasms and very tender, he did have diarrhea at the end of the day.  Could not stand was very uncomfortable.  Sat he took tylenol and the fever went away and then the symptoms went away late Sat night and has felt fine since.  Three weeks prior the same thing happened and would like to know what to do in the future if this happens again.  Please advise. Follow-up by: Chales Abrahams CMA Duncan Dull),  October 06, 2010 9:27 AM  Additional Follow-up for Phone Call Additional follow up Details #1::        (covering for Dr. Arlyce Dice); If it happens again he needs to call us or go to ER.    Ask if he has seen Dr. Donell Beers yet Additional Follow-up by: Rachael Fee MD,  October 06, 2010 9:36 AM    Additional Follow-up for Phone Call Additional follow up Details #2::    pt has appt with Dr Donell Beers on 10/19/10 and will call us or go to ER if this happen again Follow-up by: Chales Abrahams CMA Duncan Dull),  October 06, 2010 10:08 AM  Additional Follow-up for Phone Call Additional follow up Details #3:: Details for Additional Follow-up Action Taken: ok Additional Follow-up by: Rachael Fee MD,  October 06, 2010 10:32 AM

## 2010-10-19 ENCOUNTER — Other Ambulatory Visit (HOSPITAL_COMMUNITY): Payer: Self-pay | Admitting: General Surgery

## 2010-10-19 DIAGNOSIS — K91840 Postprocedural hemorrhage and hematoma of a digestive system organ or structure following a digestive system procedure: Secondary | ICD-10-CM

## 2010-10-19 DIAGNOSIS — K8689 Other specified diseases of pancreas: Secondary | ICD-10-CM

## 2010-10-20 ENCOUNTER — Ambulatory Visit (HOSPITAL_COMMUNITY)
Admission: RE | Admit: 2010-10-20 | Discharge: 2010-10-20 | Disposition: A | Discharge status: Home / Self Care | Payer: BC Managed Care – PPO | Source: Ambulatory Visit | Attending: General Surgery | Admitting: General Surgery

## 2010-10-20 DIAGNOSIS — K8689 Other specified diseases of pancreas: Secondary | ICD-10-CM

## 2010-10-20 DIAGNOSIS — K859 Acute pancreatitis without necrosis or infection, unspecified: Secondary | ICD-10-CM | POA: Insufficient documentation

## 2010-10-20 MED ORDER — GADOBENATE DIMEGLUMINE 529 MG/ML IV SOLN
15.0000 mL | Freq: Once | INTRAVENOUS | Status: AC | PRN
  Administered 2010-10-20: 15 mL via INTRAVENOUS

## 2010-10-27 ENCOUNTER — Telehealth: Payer: Self-pay | Admitting: Gastroenterology

## 2010-10-27 NOTE — Telephone Encounter (Signed)
Pt has questions about upcoming procedure with Dr Arlyce Dice that Dr Donell Beers recommended.  He also has questions about the treatment of pancreatitis.

## 2010-10-27 NOTE — Telephone Encounter (Signed)
Pt called and has several questions. 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 He was told his stent will be removed in 4-6 weeks by Dr. Arlyce Dice. Pt wants to know: 1) Should he follow any special diet for this diagnosis? 2) If he has another attack will he be treated with corticosteroids? 3) When the stent is removed will another one be placed? 4) Dr. Donell Beers told him that he has a blood clot on the vessel going to his liver, states that he was told he would be placed on blood thinners for the clot while in the hospital, is this going to be given to him? 5) When will stent be removed.  Dr. Arlyce Dice please advise.

## 2010-10-28 NOTE — Telephone Encounter (Signed)
Excellent questions.  I want to see him in the office next week when I will go over all these issues with him

## 2010-10-28 NOTE — Telephone Encounter (Signed)
Pt scheduled to see Dr. Arlyce Dice 11/04/10@10 :30am. Pt aware of appt date and time.

## 2010-11-04 ENCOUNTER — Other Ambulatory Visit (INDEPENDENT_AMBULATORY_CARE_PROVIDER_SITE_OTHER): Payer: BC Managed Care – PPO

## 2010-11-04 ENCOUNTER — Encounter: Payer: Self-pay | Admitting: Gastroenterology

## 2010-11-04 ENCOUNTER — Ambulatory Visit (INDEPENDENT_AMBULATORY_CARE_PROVIDER_SITE_OTHER): Payer: BC Managed Care – PPO | Admitting: Gastroenterology

## 2010-11-04 DIAGNOSIS — K859 Acute pancreatitis without necrosis or infection, unspecified: Secondary | ICD-10-CM

## 2010-11-04 DIAGNOSIS — R933 Abnormal findings on diagnostic imaging of other parts of digestive tract: Secondary | ICD-10-CM

## 2010-11-04 LAB — LIPID PANEL
Cholesterol: 163 mg/dL (ref 0–200)
LDL Cholesterol: 76 mg/dL (ref 0–99)
Triglycerides: 104 mg/dL (ref 0.0–149.0)

## 2010-11-04 NOTE — Assessment & Plan Note (Addendum)
At issue is whether  the changes seen by CT in the head of the pancreas and the "double duct" sign are secondary to pancreatitis or from an occult neoplasm. Clinically pancreatitis is much more likely. Tumor markers have been negative to date.  Recommendations #1 repeat CEA and CA-19 9. #2 ERCP with stent removal. Repeat pancreatogram  And cholangiogram will be done. #3 followup MRI in 4-6 weeks. Attention will also be paid to the vessels as well since an SMV clot was seen at his initial CT, but not a followup CT  The risks, benefits, and possible complications of the procedure, including bleeding, perforation, surgery, and the 5-10% risk for pancreatitis, were explained to the patient.  Patient's questions were answered.

## 2010-11-04 NOTE — Patient Instructions (Signed)
Endoscopic Retrograde Cholangiopancreatography   This is a test used to evaluate people with jaundice (a condition in which the person's skin is turning yellow because of the high level of bilirubin in your blood). Bilirubin is a product of the blood which is elevated when an obstruction in the bile duct occurs. The bile ducts are the pipe-like system which carry the bile from the liver to the gallbladder and out into your small bowel. In this test, a fiber optic endoscope (a small pencil-sized telescope) is inserted and a catheter is put into ducts for examination. This test can reveal stones, strictures, cysts, tumors, and other irregularities within the pancreatic ducts and bile ducts.   PREPARATION FOR TEST: Do not eat or drink after midnight the day before the test.     NORMAL FINDINGS: Normal size of biliary and pancreatic ducts. No obstruction or filling defects within the biliary or pancreatic ducts.    __________________________________________________.   MEANING OF TEST Your caregiver will go over the test results with you and discuss the importance and meaning of your results, as well as treatment options and the need for additional tests if necessary.   OBTAINING THE TEST RESULTS It is your responsibility to obtain your test results.  Ask the lab or department performing the test when and how you will get your results. " Normal" ranges for lab values and other tests may vary among different laboratories and/or hospitals.  You should always check with your doctor after having lab work or other tests done to discuss the meaning of your test results and whether or not your values are considered "within normal limits".   Document Released: 11/05/2004  Document Re-Released: 06/24/2008 [facility] Patient Information 2011 [place], [place].  Your ERCP is scheduled on 12/09/2010 at [facility] department You will arrive 2 hours prior to your scheduled appointment

## 2010-11-04 NOTE — Progress Notes (Signed)
History of Present Illness:  [name] has returned for followup of his pancreatic abnormality. Since his last visit he underwent EUS with biopsies that were inconclusive. Followup MRI of the pancreas demonstrated partial resolution of inflammatory changes in the head of the pancreas. A biliary stent is in place. Pancreatic ductal dilatation remains.  He  feels well and has gained 25 pounds. Biopsies of the duodenum demonstrated inflammatory changes only. He remains on omeprazole. He has no other GI complaints except for excessive hunger.    Review of Systems: Pertinent positive and negative review of systems were noted in the above HPI section. All other review of systems were otherwise negative.    Current Medications, Allergies, Past Medical History, Past Surgical History, Family History and Social History were reviewed in [employer] electronic medical record  Vital signs were reviewed in today's medical record. Physical Exam: General: Well developed , well nourished, no acute distress  On abdominal exam there are no dominant masses, tenderness, organomegaly

## 2010-11-04 NOTE — Assessment & Plan Note (Signed)
Etiology for  pancreatitis has not been determined. A serum IgG 4 level was negative. He has no risk factors for pancreatitis.

## 2010-11-26 ENCOUNTER — Telehealth: Payer: Self-pay

## 2010-11-26 NOTE — Telephone Encounter (Signed)
Pt states that he was supposed to call us back if he started having symptoms again. Pt states that on Sunday he was nauseated but reports that he ate pepper, onions, sausage, and drank 1 beer. Pt states the symptoms resolved in about 24-36 hours. I instructed pt that what he ate most likely caused the symptoms and let pt know that I would let Dr. Arlyce Dice know about the symptoms that he experienced.

## 2010-12-03 ENCOUNTER — Telehealth: Payer: Self-pay

## 2010-12-03 NOTE — Telephone Encounter (Signed)
Either 1 is fine ----- Message ----- From: Lily Lovings, RN Sent: 12/03/2010 8:16 AM To: Louis Meckel, MD Subject: ERCP   Dr. Cyndia Skeeters scheduled this ERCP with Mac at [facility] for 5/16 at lunch time. The hospital messed up and said they don't do Mac except Tues/Thurs and first Friday of the month. They have a 1pm slot on the 24th or they could do June 1st. What do you want me to tell them?  Bonita Quin

## 2010-12-03 NOTE — Telephone Encounter (Signed)
Pts ERCP was moved to 12/17/10 @1pm , pt to arrive at 11:30am and be NPO after midnight. Scheduled with Noreene Larsson. Pt aware.

## 2010-12-09 ENCOUNTER — Other Ambulatory Visit: Payer: BC Managed Care – PPO | Admitting: Gastroenterology

## 2010-12-17 ENCOUNTER — Ambulatory Visit (HOSPITAL_COMMUNITY)
Admission: RE | Admit: 2010-12-17 | Discharge: 2010-12-17 | Disposition: A | Discharge status: Home / Self Care | Payer: BC Managed Care – PPO | Source: Ambulatory Visit | Attending: Gastroenterology | Admitting: Gastroenterology

## 2010-12-17 ENCOUNTER — Other Ambulatory Visit: Payer: BC Managed Care – PPO | Admitting: Gastroenterology

## 2010-12-17 ENCOUNTER — Ambulatory Visit (HOSPITAL_COMMUNITY): Payer: BC Managed Care – PPO

## 2010-12-17 DIAGNOSIS — K2980 Duodenitis without bleeding: Secondary | ICD-10-CM | POA: Insufficient documentation

## 2010-12-17 DIAGNOSIS — Z4659 Encounter for fitting and adjustment of other gastrointestinal appliance and device: Secondary | ICD-10-CM | POA: Insufficient documentation

## 2010-12-17 DIAGNOSIS — K831 Obstruction of bile duct: Secondary | ICD-10-CM

## 2010-12-17 DIAGNOSIS — K8689 Other specified diseases of pancreas: Secondary | ICD-10-CM

## 2011-01-26 ENCOUNTER — Encounter: Payer: Self-pay | Admitting: Gastroenterology

## 2011-01-26 ENCOUNTER — Ambulatory Visit (INDEPENDENT_AMBULATORY_CARE_PROVIDER_SITE_OTHER): Payer: BC Managed Care – PPO | Admitting: Gastroenterology

## 2011-01-26 VITALS — BP 148/96 | HR 60 | Ht 72.0 in | Wt 168.2 lb

## 2011-01-26 DIAGNOSIS — K859 Acute pancreatitis without necrosis or infection, unspecified: Secondary | ICD-10-CM

## 2011-01-26 NOTE — Progress Notes (Signed)
History of Present Illness:  [name] has returned following  removal of his bile duct stent. He continues to feel well and has no GI complaints including pain or anorexia. Weight continues to climb. Altogether he feels well. ERCP demonstrated mild dilatation in the midportion of the hepatic duct to the tail. No filling defects were seen on his cholangiogram.    Review of Systems: Pertinent positive and negative review of systems were noted in the above HPI section. All other review of systems were otherwise negative.    Current Medications, Allergies, Past Medical History, Past Surgical History, Family History and Social History were reviewed in [employer] electronic medical record  Vital signs were reviewed in today's medical record. Physical Exam: General: Well developed , well nourished, no acute distress Head: Normocephalic and atraumatic Eyes:  sclerae anicteric, EOMI Ears: Normal auditory acuity Mouth: No deformity or lesions Lungs: Clear throughout to auscultation Heart: Regular rate and rhythm; no murmurs, rubs or bruits Abdomen: Soft, non tender and non distended. No masses, hepatosplenomegaly or hernias noted. Normal Bowel sounds Rectal:deferred Musculoskeletal: Symmetrical with no gross deformities  Pulses:  Normal pulses noted Extremities: No clubbing, cyanosis, edema or deformities noted Neurological: Alert oriented x 4, grossly nonfocal Psychological:  Alert and cooperative. Normal mood and affect

## 2011-01-26 NOTE — Patient Instructions (Signed)
Follow up as needed

## 2011-01-26 NOTE — Assessment & Plan Note (Addendum)
Etiology has never been determined. There is no evidence for choledocholithiasis or cholelithiasis. He's on no medications associated with pancreatitis. Alcohol is not a factor. Autoimmune pancreatitis is a consideration although his IgG 4 level was normal.  Conditions #1 no further workup. At this point there is no evidence for pancreatic malignancy.

## 2011-03-16 ENCOUNTER — Encounter: Payer: Self-pay | Admitting: Family Medicine

## 2011-03-16 ENCOUNTER — Ambulatory Visit (INDEPENDENT_AMBULATORY_CARE_PROVIDER_SITE_OTHER): Payer: BC Managed Care – PPO | Admitting: Family Medicine

## 2011-03-16 VITALS — BP 130/90 | Temp 97.9°F | Ht 70.5 in | Wt 167.0 lb

## 2011-03-16 DIAGNOSIS — Z0000 Encounter for general adult medical examination without abnormal findings: Secondary | ICD-10-CM

## 2011-03-16 LAB — CBC WITH DIFFERENTIAL/PLATELET
Eosinophils Relative: 1 % (ref 0.0–5.0)
HCT: 42.8 % (ref 39.0–52.0)
Hemoglobin: 14.1 g/dL (ref 13.0–17.0)
Lymphs Abs: 0.6 10*3/uL — ABNORMAL LOW (ref 0.7–4.0)
MCV: 96.5 fl (ref 78.0–100.0)
Monocytes Absolute: 0.5 10*3/uL (ref 0.1–1.0)
Monocytes Relative: 9.5 % (ref 3.0–12.0)
Neutro Abs: 4.5 10*3/uL (ref 1.4–7.7)
WBC: 5.8 10*3/uL (ref 4.5–10.5)

## 2011-03-16 LAB — LIPID PANEL
Cholesterol: 184 mg/dL (ref 0–200)
Triglycerides: 120 mg/dL (ref 0.0–149.0)

## 2011-03-16 LAB — PSA: PSA: 1.16 ng/mL (ref 0.10–4.00)

## 2011-03-16 LAB — POCT URINALYSIS DIPSTICK
Bilirubin, UA: NEGATIVE
Glucose, UA: NEGATIVE
Ketones, UA: NEGATIVE
Spec Grav, UA: 1.02
Urobilinogen, UA: 0.2

## 2011-03-16 LAB — BASIC METABOLIC PANEL
Chloride: 104 mEq/L (ref 96–112)
GFR: 93.22 mL/min (ref >60.00)
Glucose, Bld: 107 mg/dL — ABNORMAL HIGH (ref 70–99)
Potassium: 4.7 mEq/L (ref 3.5–5.1)
Sodium: 142 mEq/L (ref 135–145)

## 2011-03-16 LAB — HEPATIC FUNCTION PANEL
ALT: 24 U/L (ref 0–53)
AST: 26 U/L (ref 0–37)
Albumin: 4.4 g/dL (ref 3.5–5.2)
Total Protein: 7.3 g/dL (ref 6.0–8.3)

## 2011-03-16 LAB — TSH: TSH: 2.19 u[IU]/mL (ref 0.35–5.50)

## 2011-03-16 NOTE — Progress Notes (Signed)
  Subjective:    Patient ID: James Giles, male    DOB: March 06, 1966, 45 y.o.   MRN: [id number]  HPI   [name] is a 45 year old, married male, nonsmoker, who comes in today for general physical examination because of a history of pancreatitis.  Last spring.  He presented with weight loss and abdominal pain was hospitalized and found to have pancreatitis, etiology unknown.  They felt it was most likely a common duct stone.  A stent was placed and subsequently removed in July.  He's had no problems since.  He gets routine eye care, dental care, tetanus, 2012.  His paternal grandfather and father both had prostate cancer, but they got it when there were over 61.  Also, his mother and father both have a history of colon polyps.    Review of Systems  Constitutional: Negative.   HENT: Negative.   Eyes: Negative.   Respiratory: Negative.   Cardiovascular: Negative.   Gastrointestinal: Negative.   Genitourinary: Negative.   Musculoskeletal: Negative.   Skin: Negative.   Neurological: Negative.   Hematological: Negative.   Psychiatric/Behavioral: Negative.        Objective:   Physical Exam  Constitutional: He is oriented to person, place, and time. He appears well-developed and well-nourished.  HENT:  Head: Normocephalic and atraumatic.  Right Ear: External ear normal.  Left Ear: External ear normal.  Nose: Nose normal.  Mouth/Throat: Oropharynx is clear and moist.  Eyes: Conjunctivae and EOM are normal. Pupils are equal, round, and reactive to light.  Neck: Normal range of motion. Neck supple. No JVD present. No tracheal deviation present. No thyromegaly present.  Cardiovascular: Normal rate, regular rhythm, normal heart sounds and intact distal pulses.  Exam reveals no gallop and no friction rub.   No murmur heard. Pulmonary/Chest: Effort normal and breath sounds normal. No stridor. No respiratory distress. He has no wheezes. He has no rales. He exhibits no tenderness.  Abdominal: Soft.  Bowel sounds are normal. He exhibits no distension and no mass. There is no tenderness. There is no rebound and no guarding.  Genitourinary: Rectum normal, prostate normal and penis normal. Guaiac negative stool. No penile tenderness.  Musculoskeletal: Normal range of motion. He exhibits no edema and no tenderness.  Lymphadenopathy:    He has no cervical adenopathy.  Neurological: He is alert and oriented to person, place, and time. He has normal reflexes. No cranial nerve deficit. He exhibits normal muscle tone.  Skin: Skin is warm and dry. No rash noted. No erythema. No pallor.  Psychiatric: He has a normal mood and affect. His behavior is normal. Judgment and thought content normal.          Assessment & Plan:  Healthy male.  History of pancreatitis, probably secondary to gallstones asymptomatic at this juncture.  Family history prostate cancer.  Paternal grandfather and father.  Check PSA.  Positive family history of colon polyps.  Both mother and father will send a note to Dr. Arlyce Dice.  Recommend screening colonoscopy

## 2011-03-16 NOTE — Patient Instructions (Signed)
Continue your good health habits.  Return in one year for general physical examination sooner if any problems.  I will send a note to Dr. Arlyce Dice, I would consider a screening colonoscopy since both your mother and father had a history of colon polyps.  Also in addition to your physical examination we will begin to do annual PSA is because of the family history of prostate cancer

## 2011-05-25 IMAGING — RF DG ERCP WO/W SPHINCTEROTOMY
9 series · 9 of 9 positions shown · non-contrast
Comparison: 08/28/2010 and abdominal CT from 09/21/2010

CLINICAL DATA: Stent removal.

ERCP
TECHNIQUE: Multiple spot images obtained with the fluoroscopic
device and submitted for interpretation post-procedure.  ERCP was
performed by Dr. Abe.

[Series 1: cont. · 1 of 1 slices shown (1 of 9)]
[im 1/1]
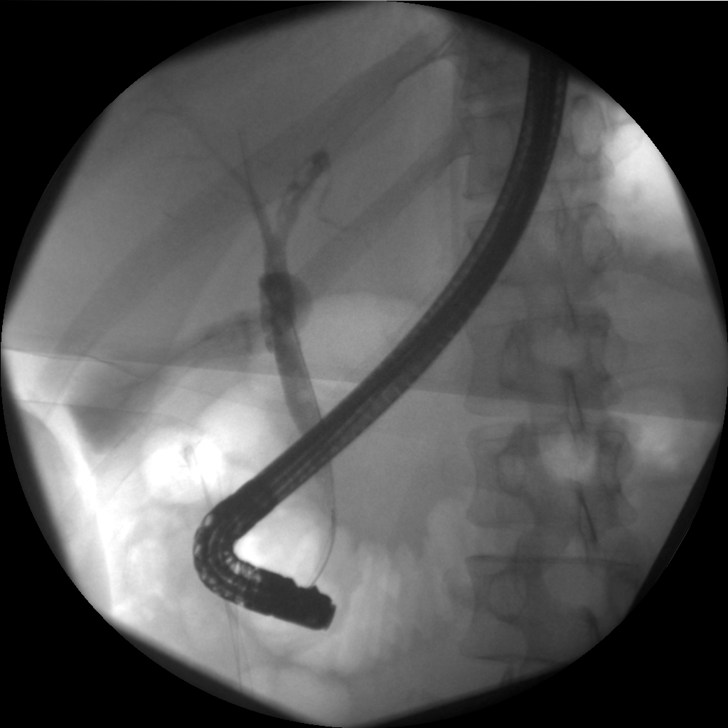

[Series 2: cont. · 1 of 1 slices shown (2 of 9)]
[im 1/1]
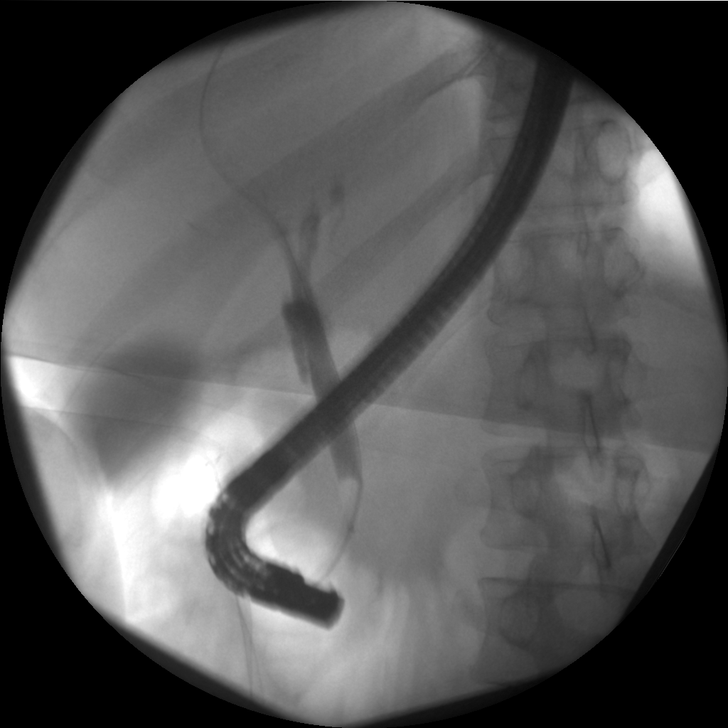

[Series 3: cont. · 1 of 1 slices shown (3 of 9)]
[im 1/1]
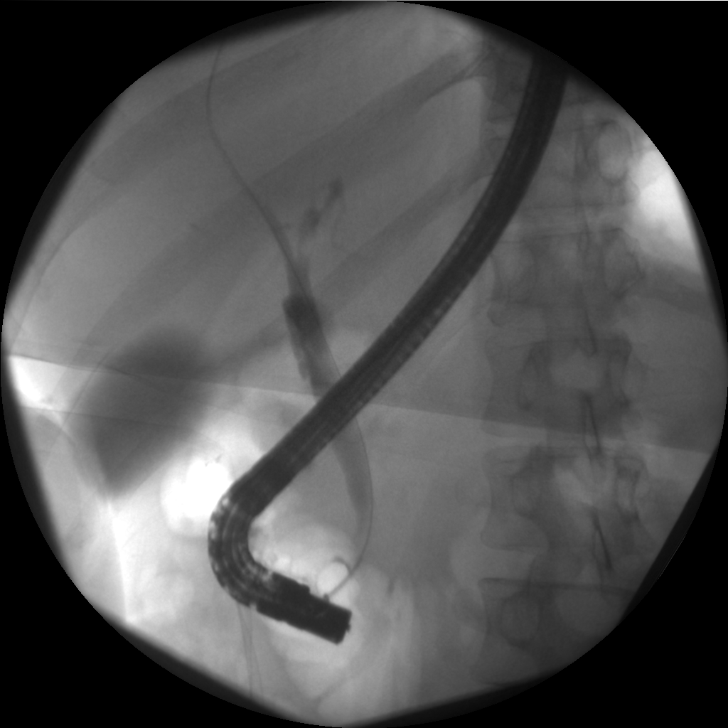

[Series 4: cont. · 1 of 1 slices shown (4 of 9)]
[im 1/1]
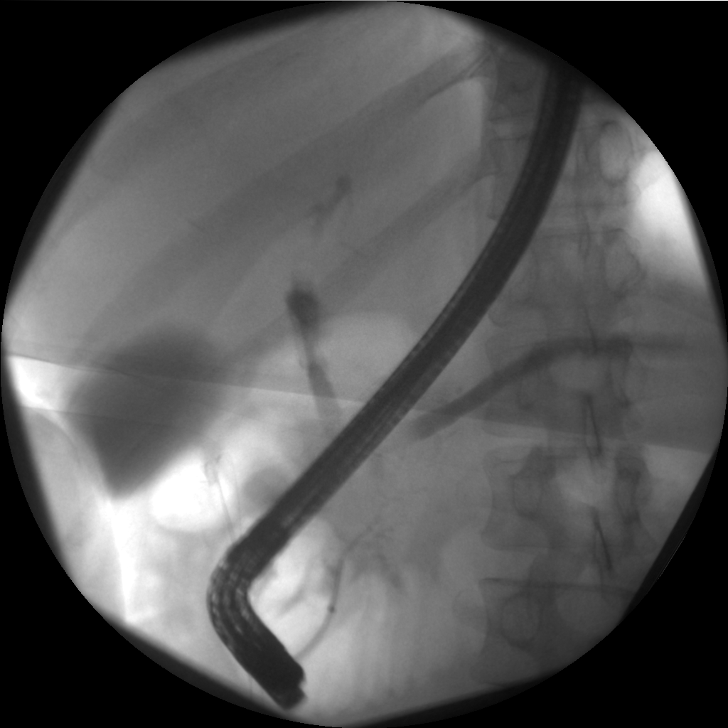

[Series 5: cont. · 1 of 1 slices shown (5 of 9)]
[im 1/1]
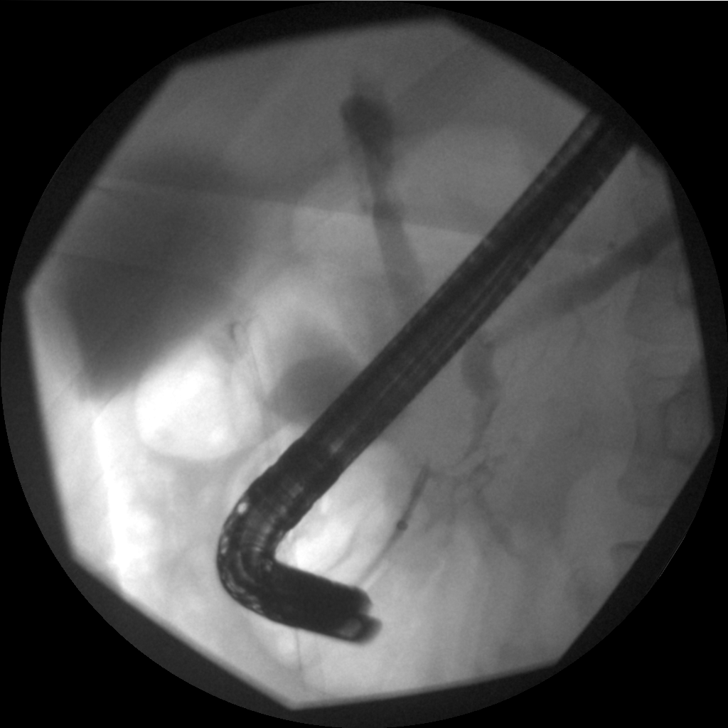

[Series 6: cont. · 1 of 1 slices shown (6 of 9)]
[im 1/1]
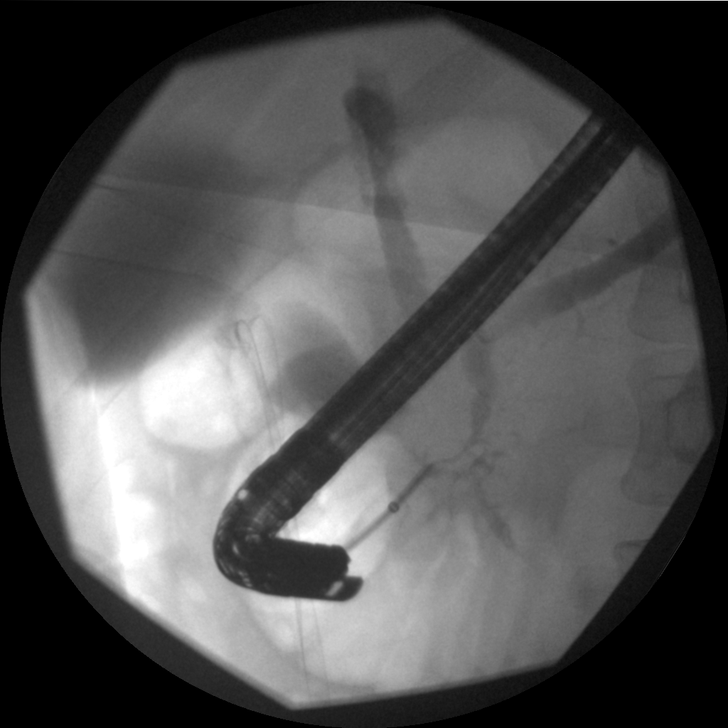

[Series 7: cont. · 1 of 1 slices shown (7 of 9)]
[im 1/1]
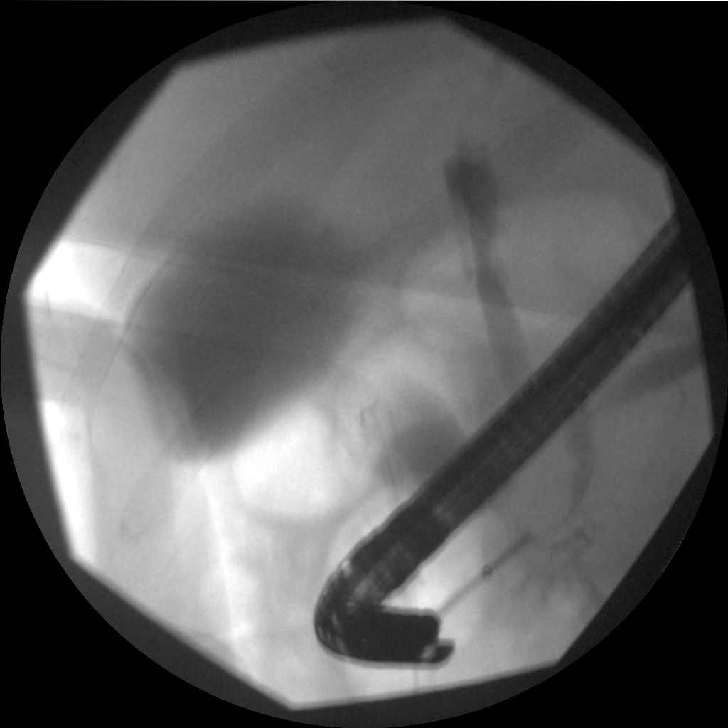

[Series 8: cont. · 1 of 1 slices shown (8 of 9)]
[im 1/1]
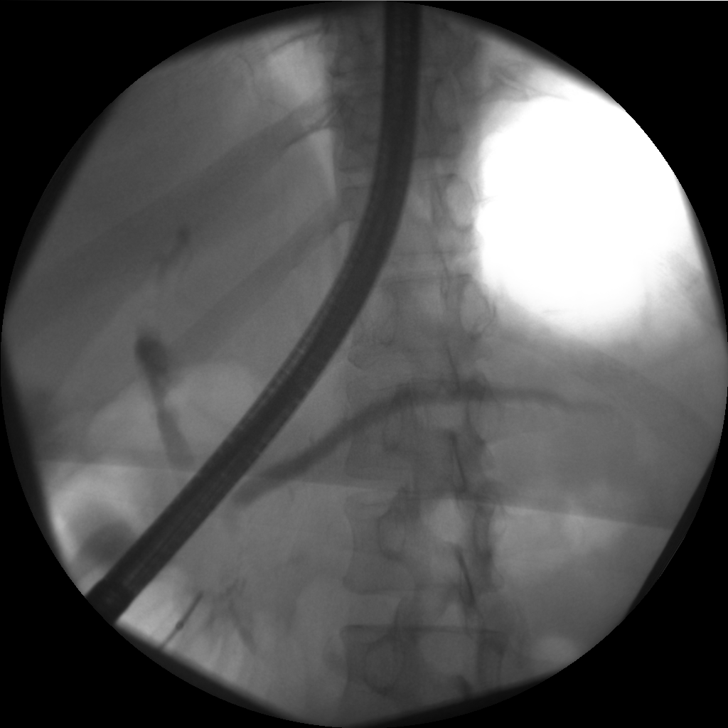

[Series 9: cont. · 1 of 1 slices shown (9 of 9)]
[im 1/1]
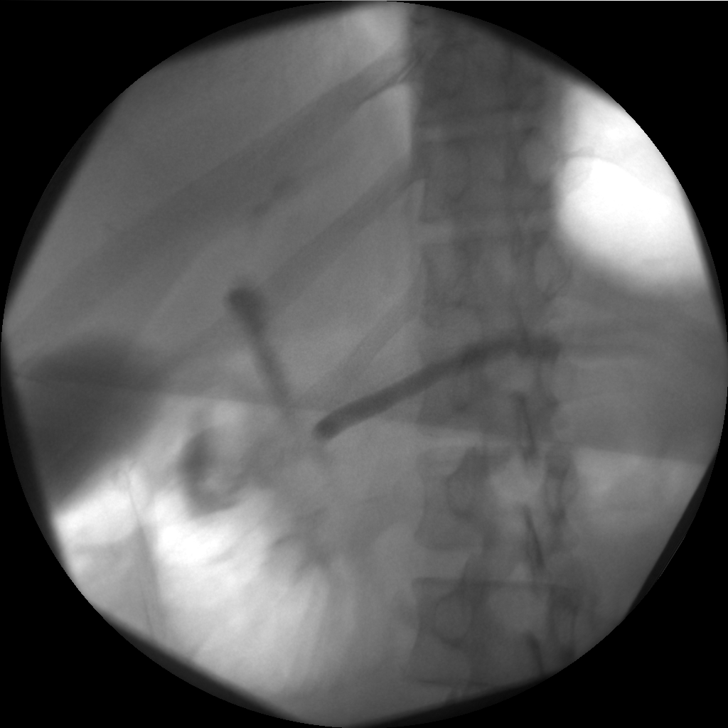

[9 of 9 positions shown; findings below may reference images not displayed]

FINDINGS: Cannulization and opacification of the common bile duct.
There are small filling defects within the common bile duct and
central left intrahepatic duct.  Evidence for stone removal with a
balloon.  Decreased stone burden following balloon use. However,
there are residual stones in the common hepatic duct and left
intrahepatic ducts.  Again noted is dilatation of the pancreatic
duct, particularly in the pancreatic body and tail region.
Contrast fills the gallbladder on the later images.
IMPRESSION: Removal of biliary stones.  Probable residual stones
near the common hepatic duct and left intrahepatic ducts.

Dilated pancreatic duct.

These images were submitted for radiologic interpretation only.
Please see the procedural report for the amount of contrast and the
fluoroscopy time utilized.

## 2012-12-05 ENCOUNTER — Other Ambulatory Visit (INDEPENDENT_AMBULATORY_CARE_PROVIDER_SITE_OTHER): Payer: 59

## 2012-12-05 DIAGNOSIS — Z0000 Encounter for general adult medical examination without abnormal findings: Secondary | ICD-10-CM

## 2012-12-05 LAB — CBC WITH DIFFERENTIAL/PLATELET
Basophils Relative: 0.7 % (ref 0.0–3.0)
Eosinophils Relative: 2.9 % (ref 0.0–5.0)
Hemoglobin: 14.4 g/dL (ref 13.0–17.0)
MCV: 91.3 fl (ref 78.0–100.0)
Monocytes Absolute: 0.6 10*3/uL (ref 0.1–1.0)
Neutrophils Relative %: 68.5 % (ref 43.0–77.0)
RBC: 4.59 Mil/uL (ref 4.22–5.81)
WBC: 6 10*3/uL (ref 4.5–10.5)

## 2012-12-05 LAB — LIPID PANEL
Total CHOL/HDL Ratio: 4
VLDL: 130.6 mg/dL — ABNORMAL HIGH (ref 0.0–40.0)

## 2012-12-05 LAB — BASIC METABOLIC PANEL
Chloride: 107 mEq/L (ref 96–112)
Creatinine, Ser: 0.8 mg/dL (ref 0.4–1.5)
Potassium: 4.2 mEq/L (ref 3.5–5.1)
Sodium: 140 mEq/L (ref 135–145)

## 2012-12-05 LAB — POCT URINALYSIS DIPSTICK
Ketones, UA: NEGATIVE
Protein, UA: NEGATIVE
Spec Grav, UA: >=1.03
pH, UA: 5.5

## 2012-12-05 LAB — HEPATIC FUNCTION PANEL
ALT: 26 U/L (ref 0–53)
Alkaline Phosphatase: 62 U/L (ref 39–117)
Bilirubin, Direct: 0.1 mg/dL (ref 0.0–0.3)
Total Protein: 6.8 g/dL (ref 6.0–8.3)

## 2012-12-12 ENCOUNTER — Ambulatory Visit (INDEPENDENT_AMBULATORY_CARE_PROVIDER_SITE_OTHER): Payer: 59 | Admitting: Family Medicine

## 2012-12-12 ENCOUNTER — Encounter: Payer: Self-pay | Admitting: Family Medicine

## 2012-12-12 VITALS — BP 130/90 | Temp 98.0°F | Ht 71.0 in | Wt 182.0 lb

## 2012-12-12 DIAGNOSIS — J309 Allergic rhinitis, unspecified: Secondary | ICD-10-CM

## 2012-12-12 DIAGNOSIS — L408 Other psoriasis: Secondary | ICD-10-CM

## 2012-12-12 DIAGNOSIS — M109 Gout, unspecified: Secondary | ICD-10-CM

## 2012-12-12 DIAGNOSIS — E782 Mixed hyperlipidemia: Secondary | ICD-10-CM

## 2012-12-12 DIAGNOSIS — Z0000 Encounter for general adult medical examination without abnormal findings: Secondary | ICD-10-CM

## 2012-12-12 DIAGNOSIS — R1011 Right upper quadrant pain: Secondary | ICD-10-CM

## 2012-12-12 DIAGNOSIS — Z8371 Family history of colonic polyps: Secondary | ICD-10-CM

## 2012-12-12 DIAGNOSIS — L409 Psoriasis, unspecified: Secondary | ICD-10-CM

## 2012-12-12 DIAGNOSIS — Z8379 Family history of other diseases of the digestive system: Secondary | ICD-10-CM

## 2012-12-12 MED ORDER — PREDNISONE 20 MG PO TABS: ORAL_TABLET | ORAL | Status: DC

## 2012-12-12 MED ORDER — ALLOPURINOL 300 MG PO TABS: 300.0000 mg | ORAL_TABLET | Freq: Every day | ORAL | Status: DC

## 2012-12-12 MED ORDER — HYDROCODONE-ACETAMINOPHEN 10-325 MG PO TABS: 1.0000 | ORAL_TABLET | Freq: Three times a day (TID) | ORAL | Status: DC | PRN

## 2012-12-12 MED ORDER — FENOFIBRATE 145 MG PO TABS: 145.0000 mg | ORAL_TABLET | Freq: Every day | ORAL | Status: DC

## 2012-12-12 NOTE — Patient Instructions (Addendum)
Stay on a complete fat-free diet  We will get you set up for an ultrasound of your gallbladder  We will also get you set up for a colonoscopy  Begin allopurinol 1 daily  If this triggers a episode of gout take a short course of prednisone  Return after the ultrasound of your gallbladder for followup  Also begin TriCor 1 tablet daily to reduce your triglyceride level

## 2012-12-12 NOTE — Progress Notes (Signed)
  Subjective:    Patient ID: James Giles, male    DOB: [birth year], 47 y.o.   MRN: [id number]  HPI James Giles is a 47 year old married male nonsmoker who comes in today for general physical examination  This past winter he had an episode of gout. He went to an urgent care and was given prednisone. Blood tests showed an elevated uric acid level. He was not given allopurinol. He had a gouty attack about 7 years prior  He is status in January and February he was sick a lot. He had a lot of GI issues. His grandfather had gallbladder disease. He changed his diet and now his symptoms have abated. Him concerned he may have underlying gallbladder disease.  His mother and father both had colon polyps a maternal aunt had colon cancer therefore we'll set him up for colonoscopy this year.  He does have underlying psoriasis he's treated for this by a dermatologist  He was fasting for his lab work however his triglyceride level was 653. He has had an episode in the past of unexplained pancreatitis  Tetanus booster 2012.   Review of Systems  Constitutional: Negative.   HENT: Negative.   Eyes: Negative.   Respiratory: Negative.   Cardiovascular: Negative.   Gastrointestinal: Negative.   Genitourinary: Negative.   Musculoskeletal: Negative.   Skin: Negative.   Neurological: Negative.   Psychiatric/Behavioral: Negative.        Objective:   Physical Exam  Constitutional: He is oriented to person, place, and time. He appears well-developed and well-nourished.  HENT:  Head: Normocephalic and atraumatic.  Right Ear: External ear normal.  Left Ear: External ear normal.  Nose: Nose normal.  Mouth/Throat: Oropharynx is clear and moist.  Eyes: Conjunctivae and EOM are normal. Pupils are equal, round, and reactive to light.  Neck: Normal range of motion. Neck supple. No JVD present. No tracheal deviation present. No thyromegaly present.  Cardiovascular: Normal rate, regular rhythm, normal heart sounds and  intact distal pulses.  Exam reveals no gallop and no friction rub.   No murmur heard. Pulmonary/Chest: Effort normal and breath sounds normal. No stridor. No respiratory distress. He has no wheezes. He has no rales. He exhibits no tenderness.  Abdominal: Soft. Bowel sounds are normal. He exhibits no distension and no mass. There is no tenderness. There is no rebound and no guarding.  Genitourinary: Rectum normal, prostate normal and penis normal. Guaiac negative stool. No penile tenderness.  Musculoskeletal: Normal range of motion. He exhibits no edema and no tenderness.  Lymphadenopathy:    He has no cervical adenopathy.  Neurological: He is alert and oriented to person, place, and time. He has normal reflexes. No cranial nerve deficit. He exhibits normal muscle tone.  Skin: Skin is warm and dry. No rash noted. No erythema. No pallor.  He does have psoriatic lesions mostly involving his buttocks  Psychiatric: He has a normal mood and affect. His behavior is normal. Judgment and thought content normal.          Assessment & Plan:  Healthy male  History of gout start allopurinol 300 mg daily  History of psoriasis continue treatment via dermatologist  History of pancreatitis and elevated triglycerides etiology unknown ultrasound check gallbladder  Family history colon polyps and a maternal ear with cancer set up for screening colonoscopy

## 2012-12-20 ENCOUNTER — Ambulatory Visit
Admission: RE | Admit: 2012-12-20 | Discharge: 2012-12-20 | Disposition: A | Discharge status: Home / Self Care | Payer: 59 | Source: Ambulatory Visit | Attending: Family Medicine | Admitting: Family Medicine

## 2012-12-20 DIAGNOSIS — R1011 Right upper quadrant pain: Secondary | ICD-10-CM

## 2012-12-20 DIAGNOSIS — Z8379 Family history of other diseases of the digestive system: Secondary | ICD-10-CM

## 2012-12-28 ENCOUNTER — Ambulatory Visit (INDEPENDENT_AMBULATORY_CARE_PROVIDER_SITE_OTHER): Payer: 59 | Admitting: Family Medicine

## 2012-12-28 ENCOUNTER — Encounter: Payer: Self-pay | Admitting: Family Medicine

## 2012-12-28 VITALS — BP 140/90 | Temp 98.9°F | Wt 184.0 lb

## 2012-12-28 DIAGNOSIS — R1011 Right upper quadrant pain: Secondary | ICD-10-CM

## 2012-12-28 NOTE — Progress Notes (Signed)
  Subjective:    Patient ID: James Giles, male    DOB: [birth year], 47 y.o.   MRN: [id number]  HPI James Giles is a 47 year old male who comes in today for followup of abdominal pain  The ultrasound of his abdomen shows his gallbladder appears normal liver appears normal the common bile duct appears enlarged.  He's had ERCP and a stent placed in the past. This stent was subsequently removed. We don't see any stones in his common bile duct although it may be thickening from previous stent placement   Review of Systems    review of systems otherwise negative Objective:   Physical Exam  Well-developed well-nourished male no acute distress      Assessment & Plan:  Enlarged common bile duct plan continue fat-free flap diet he has an appointment in GI ASAP with Dr. Arlyce Dice

## 2012-12-28 NOTE — Patient Instructions (Addendum)
Complete fat-free diet  Followup in GI as outlined

## 2013-01-10 ENCOUNTER — Encounter: Payer: Self-pay | Admitting: Gastroenterology

## 2013-01-10 ENCOUNTER — Ambulatory Visit (INDEPENDENT_AMBULATORY_CARE_PROVIDER_SITE_OTHER): Payer: 59 | Admitting: Gastroenterology

## 2013-01-10 VITALS — BP 132/88 | HR 84 | Ht 70.5 in | Wt 182.4 lb

## 2013-01-10 DIAGNOSIS — R933 Abnormal findings on diagnostic imaging of other parts of digestive tract: Secondary | ICD-10-CM

## 2013-01-10 DIAGNOSIS — K859 Acute pancreatitis without necrosis or infection, unspecified: Secondary | ICD-10-CM

## 2013-01-10 NOTE — Patient Instructions (Addendum)
You have been scheduled for an MRCP at [facility] on 6/25/20142. Your appointment time is 12:00. Please arrive 15 minutes prior to your appointment time for registration purposes.Nothing to eat or drink 4 hours before test. However, if you have any metal in your body, have a pacemaker or defibrillator, please be sure to let your ordering physician know. This test typically takes 45 minutes to 1 hour to complete.

## 2013-01-10 NOTE — Progress Notes (Signed)
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 He was hospitalized in 2012 for pancreatitis. Distal bile duct stent was placed because of a stricture. No stones were seen by ERCP or by EUS. Following stent removal repeat cholangiogram did not demonstrate any strictures. Filling defects seen at ERCP were felt to be do to air bubbles. For the past 2 years he has been symptom-free. In January he had abdominal pain of moderate severity that lasted approximately 2 days. Pain was not nearly the intensity of the pain he experienced with pancreatitis. This was in the setting of multiple GI bugs in his office. Pain subsided spontaneously and he has had no recurrence. Last month he underwent abdominal ultrasound which demonstrated dilatation of the bile duct. No acoustical shadows were seen either in the bile duct or the gallbladder.  Liver function tests in May were normal. The patient denies pruritus or jaundice.    Past Medical History  Diagnosis Date  . Seasonal allergies 08/25/2010  . Psoriasis   . Psoriasis   . Pancreatitis    Past Surgical History  Procedure Laterality Date  . Ercp     family history includes Diabetes in his maternal grandmother; Hypertension in his mother; and Prostate cancer in his father.  There is no history of Colon cancer. Current Outpatient Prescriptions  Medication Sig Dispense Refill  . allopurinol (ZYLOPRIM) 300 MG tablet Take 1 tablet (300 mg total) by mouth daily.  100 tablet  3  . calcipotriene-betamethasone (TACLONEX) ointment Apply 1 application topically daily.       No current facility-administered medications for this visit.   Allergies as of 01/10/2013  . (No Known Allergies)    reports that he has never smoked. He has never used smokeless tobacco. He reports that  drinks alcohol. He reports that he does not use illicit  drugs.     Review of Systems: Pertinent positive and negative review of systems were noted in the above HPI section. All other review of systems were otherwise negative.  Vital signs were reviewed in today's medical record Physical Exam: General: Well developed , well nourished, no acute distress Skin: anicteric Head: Normocephalic and atraumatic Eyes:  sclerae anicteric, EOMI Ears: Normal auditory acuity Mouth: No deformity or lesions Neck: Supple, no masses or thyromegaly Lungs: Clear throughout to auscultation Heart: Regular rate and rhythm; no murmurs, rubs or bruits Abdomen: Soft, non tender and non distended. No masses, hepatosplenomegaly or hernias noted. Normal Bowel sounds Rectal:deferred Musculoskeletal: Symmetrical with no gross deformities  Skin: No lesions on visible extremities Pulses:  Normal pulses noted Extremities: No clubbing, cyanosis, edema or deformities noted Neurological: Alert oriented x 4, grossly nonfocal Cervical Nodes:  No significant cervical adenopathy Inguinal Nodes: No significant inguinal adenopathy Psychological:  Alert and cooperative. Normal mood and affect

## 2013-01-10 NOTE — Assessment & Plan Note (Signed)
The patient has no GI complaints and normal liver tests. Abdominal ultrasound demonstrates dilation of the common bile duct to 10 mm. He could have an asymptomatic recurrent bile duct stricture. Bile duct stones are less likely. Doubt significant obstruction in the absence of abnormal liver tests.  Recommendations #1 MRCP

## 2013-01-17 ENCOUNTER — Other Ambulatory Visit (HOSPITAL_COMMUNITY): Payer: 59

## 2013-01-25 ENCOUNTER — Other Ambulatory Visit: Payer: Self-pay | Admitting: Gastroenterology

## 2013-01-25 ENCOUNTER — Ambulatory Visit (HOSPITAL_COMMUNITY)
Admission: RE | Admit: 2013-01-25 | Discharge: 2013-01-25 | Disposition: A | Discharge status: Home / Self Care | Payer: 59 | Source: Ambulatory Visit | Attending: Gastroenterology | Admitting: Gastroenterology

## 2013-01-25 DIAGNOSIS — K8689 Other specified diseases of pancreas: Secondary | ICD-10-CM | POA: Insufficient documentation

## 2013-01-25 DIAGNOSIS — K8020 Calculus of gallbladder without cholecystitis without obstruction: Secondary | ICD-10-CM | POA: Insufficient documentation

## 2013-01-25 DIAGNOSIS — K838 Other specified diseases of biliary tract: Secondary | ICD-10-CM | POA: Insufficient documentation

## 2013-01-25 DIAGNOSIS — K859 Acute pancreatitis without necrosis or infection, unspecified: Secondary | ICD-10-CM

## 2013-01-25 DIAGNOSIS — K7689 Other specified diseases of liver: Secondary | ICD-10-CM | POA: Insufficient documentation

## 2013-01-25 MED ORDER — GADOBENATE DIMEGLUMINE 529 MG/ML IV SOLN
20.0000 mL | Freq: Once | INTRAVENOUS | Status: AC | PRN
  Administered 2013-01-25: 17 mL via INTRAVENOUS

## 2013-01-29 ENCOUNTER — Telehealth: Payer: Self-pay | Admitting: Gastroenterology

## 2013-01-30 NOTE — Telephone Encounter (Signed)
Error

## 2013-02-12 ENCOUNTER — Telehealth: Payer: Self-pay

## 2013-02-12 NOTE — Telephone Encounter (Signed)
Pt scheduled to see Dr. Arlyce Dice 02/21/13@9 :30am. Pt aware of appt date and time.

## 2013-02-12 NOTE — Telephone Encounter (Signed)
Message copied by Chrystie Nose on Mon Feb 12, 2013 10:27 AM ------      Message from: Melvia Heaps D      Created: Tue Feb 06, 2013 11:29 AM       Needs OV      ----- Message -----         From: Louis Meckel, MD         Sent: 01/29/2013   9:48 AM           To: Louis Meckel, MD                   ------

## 2013-02-21 ENCOUNTER — Ambulatory Visit (INDEPENDENT_AMBULATORY_CARE_PROVIDER_SITE_OTHER): Payer: 59 | Admitting: Gastroenterology

## 2013-02-21 ENCOUNTER — Encounter: Payer: Self-pay | Admitting: Gastroenterology

## 2013-02-21 VITALS — BP 120/74 | HR 71 | Ht 71.0 in | Wt 180.6 lb

## 2013-02-21 DIAGNOSIS — K8020 Calculus of gallbladder without cholecystitis without obstruction: Secondary | ICD-10-CM

## 2013-02-21 DIAGNOSIS — R933 Abnormal findings on diagnostic imaging of other parts of digestive tract: Secondary | ICD-10-CM

## 2013-02-21 NOTE — Patient Instructions (Addendum)
[facility] will contact you with your appointment with Dr Ezzard Standing

## 2013-02-21 NOTE — Assessment & Plan Note (Addendum)
Patient had what appeared to be idiopathic pancreatitis in 2012 complicated by a.bile duct stricture that required temporary stenting. He was symptom-free until January of this year when he had another episode of abdominal pain reminiscent of pancreatitis. MRCP demonstrates progression of pancreatic and bile duct strictures. LFTs remain normal. For the first time gallbladder stones have been demonstrated. I believe that he has an inflammatory process with acute and perhaps subacute pancreatitis most likely secondary to cholelithiasis. On this basis I have recommended that he undergo an elective cholecystectomy with an IOC.

## 2013-02-21 NOTE — Progress Notes (Signed)
History of Present Illness:  [name] Has returned following MRCP. He's had no further episodes of pain. MRCP demonstrated progression of pancreatic and biliary ductal dilatation since March, 2012. Soft tissue fullness and edema were also described around the junction of the pancreatic head and descending colon. Gallbladder stones were seen.    Review of Systems: Pertinent positive and negative review of systems were noted in the above HPI section. All other review of systems were otherwise negative.    Current Medications, Allergies, Past Medical History, Past Surgical History, Family History and Social History were reviewed in [facility] electronic medical record  Vital signs were reviewed in today's medical record. Physical Exam: General: Well developed , well nourished, no acute distress

## 2013-03-30 ENCOUNTER — Encounter (INDEPENDENT_AMBULATORY_CARE_PROVIDER_SITE_OTHER): Payer: Self-pay | Admitting: Surgery

## 2013-03-30 ENCOUNTER — Ambulatory Visit (INDEPENDENT_AMBULATORY_CARE_PROVIDER_SITE_OTHER): Payer: 59 | Admitting: Surgery

## 2013-03-30 VITALS — BP 130/88 | HR 64 | Resp 16 | Ht 72.0 in | Wt 174.2 lb

## 2013-03-30 DIAGNOSIS — K8020 Calculus of gallbladder without cholecystitis without obstruction: Secondary | ICD-10-CM

## 2013-03-30 NOTE — Progress Notes (Signed)
 Re:   James Giles DOB:   05/22/1966 MRN:   [id number]  ASSESSMENT AND PLAN: 1.  Cholelithiasis, history of pancreatitis  I discussed with the patient the indications and risks of gall bladder surgery.  The primary risks of gall bladder surgery include, but are not limited to, bleeding, infection, common bile duct injury, and open surgery.  There is also the risk that the patient may have continued symptoms after surgery.  However, the likelihood of improvement in symptoms and return to the patient's normal status is good. We discussed the typical post-operative recovery course. I tried to answer the patient's questions.  I gave the patient literature about gall bladder surgery.  The patient understands that this may or may not improve the dilatation of his biliary or pancreatic duct.  He also understands that there are other possible causes of duct dilatation, but that biliary disease is the most likely.  2.  Progression of pancreatitic/biliary duct dilitation on MRI - 01/25/2013.  History of bile duct stricture.  Has seen Dr. R. Kaplan in consultation. 3.  Subtle liver lesions on MRI  There was no further recommendation regarding these.  I gave the patient a copy of his MRI report. 4.  Hypertriglyceridemia  12/05/2012 - 653  5.  Psoriasis 6.  History of pancreatitis 7.  History of gout  Chief Complaint  Patient presents with  . New Evaluation    eval GB   REFERRING PHYSICIAN: TODD,JEFFREY ALLEN, MD  HISTORY OF PRESENT ILLNESS: James Giles is a 47 y.o. (DOB: 08/27/1965)  white  male whose primary care physician is TODD,JEFFREY ALLEN, MD and comes to me today for gall bladder disease.  He comes by himself.  The patient had a bout of pancreatitis in February 2012 for unclear reasons.  He had an ERCP by Dr. Kaplan on 08/27/2010 and stent placement.  By the patient's recollection, he had 3 ERCP's.  He had a MRCP 10/20/2010 which showed resolution of the biliary ductal dilatation and  decrease in the inflammatory changes around the pancreas. He was seen by Dr. Byerly at that time who felt this was idiopathic pancreatitis.  He did well until January 2014 when he had abdominal pain that he attributed to a "stomach bug".  There was an illness going around the office that he thought was the cause of his symptoms. This got better.  But he had an US on 12/22/2012 that showed dilatation of the common bile duct to 10 mm.  This lead to the MRCP on 01/25/2013 which showed:  1. Progression of pancreatic and biliary ductal dilatation since  10/20/2010. Concurrent soft tissue fullness and edema centered  about the junction of the pancreatic head and descending duodenum.  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 Underlying  pancreatic head or ampullary/descending duodenal neoplasm cannot  be excluded and further evaluation with ERCP should be considered.  Given the surrounding inflammation, complicating duodenitis or recurrent pancreatitis is suspected.   2. Cholelithiasis without evidence of choledocholithiasis.  3. Subtle liver lesions which appear new since the prior exam and demonstrate probable restricted diffusion. LFT's from 12/05/2012 showed a minimally elevated T Bili - 1.4 and AST - 40.  He saw Dr. Kaplan again 02/21/2013 who thought that the duct dilatation could be caused by inflammation and Dr. Kaplan suggested the surgery go first to remove the gall bladder as a source of trouble. He has recently been disease free.    No history of stomach disease.  No history of liver disease.  No history of colon disease.  No history of colonoscopy. His grandfather had gall bladder surgery.  Both of his parents had colon polyps.   Past Medical History  Diagnosis Date  . Seasonal allergies 08/25/2010  . Psoriasis   . Psoriasis   . Pancreatitis       Past Surgical History  Procedure Laterality Date  . Ercp        Current Outpatient Prescriptions  Medication Sig Dispense Refill  . allopurinol (ZYLOPRIM) 300 MG tablet Take 1 tablet (300 mg total) by mouth daily.  100 tablet  3  . calcipotriene-betamethasone (TACLONEX) ointment Apply 1 application topically daily.       No current facility-administered medications for this visit.     No Known Allergies  REVIEW OF SYSTEMS: Skin:  No history of rash.  No history of abnormal moles. Infection:  No history of hepatitis or HIV.  No history of MRSA. Neurologic:  No history of stroke.  No history of seizure.  No history of headaches. Cardiac:  No history of hypertension. No history of heart disease.  No history of prior cardiac catheterization.  No history of seeing a cardiologist. Pulmonary:  Does not smoke cigarettes.  No asthma or bronchitis.  No OSA/CPAP.  Endocrine:  No diabetes. No thyroid disease. Hypertriglyceridemia. Gastrointestinal:  See HPI. Urologic:  No history of kidney stones.  No history of bladder infections. Musculoskeletal:  No history of joint or back disease. Hematologic:  No bleeding disorder.  No history of anemia.  Not anticoagulated. Psycho-social:  The patient is oriented.   The patient has no obvious psychologic or social impairment to understanding our conversation and plan.  SOCIAL and FAMILY HISTORY: Divorced. Lives by self. Has 3 children Originally from [place], [place], and much of his family is still up there. 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  PHYSICAL EXAM: BP 130/88  Pulse 64  Resp 16  Ht 6' (1.829 m)  Wt 174 lb 3.2 oz (79.017 kg)  BMI 23.62 kg/m2  General: WN WM who is alert and generally healthy appearing.  HEENT: Normal. Pupils equal. Neck: Supple. No mass.  No thyroid mass. Lymph Nodes:  No supraclavicular or cervical nodes. Lungs: Clear to auscultation and symmetric breath sounds. Heart:  RRR. No murmur or rub.  Abdomen: Soft. No mass. No tenderness. No hernia. Normal bowel sounds.  No  abdominal scars. Rectal: Not done. Extremities:  Good strength and ROM  in upper and lower extremities. Neurologic:  Grossly intact to motor and sensory function. Psychiatric: Has normal mood and affect. Behavior is normal.   DATA REVIEWED: Epic notes and Dr. Kaplan's notes.  Shantel Wesely, MD,  FACS [facility] [address]  [address]   [place], [place]    [address] Phone:  [phone] FAX:  [phone]  

## 2013-03-31 ENCOUNTER — Other Ambulatory Visit (INDEPENDENT_AMBULATORY_CARE_PROVIDER_SITE_OTHER): Payer: Self-pay | Admitting: Surgery

## 2013-04-11 ENCOUNTER — Encounter (HOSPITAL_COMMUNITY): Payer: Self-pay | Admitting: Pharmacy Technician

## 2013-04-12 ENCOUNTER — Encounter (HOSPITAL_COMMUNITY): Payer: Self-pay

## 2013-04-12 ENCOUNTER — Encounter (HOSPITAL_COMMUNITY)
Admission: RE | Admit: 2013-04-12 | Discharge: 2013-04-12 | Disposition: A | Discharge status: Home / Self Care | Payer: 59 | Source: Ambulatory Visit | Attending: Surgery | Admitting: Surgery

## 2013-04-12 DIAGNOSIS — Z01812 Encounter for preprocedural laboratory examination: Secondary | ICD-10-CM | POA: Insufficient documentation

## 2013-04-12 LAB — CBC WITH DIFFERENTIAL/PLATELET
Hemoglobin: 13.8 g/dL (ref 13.0–17.0)
Lymphs Abs: 1 10*3/uL (ref 0.7–4.0)
MCH: 33.6 pg (ref 26.0–34.0)
Monocytes Relative: 14 % — ABNORMAL HIGH (ref 3–12)
Neutro Abs: 5.4 10*3/uL (ref 1.7–7.7)
Neutrophils Relative %: 72 % (ref 43–77)
RBC: 4.11 MIL/uL — ABNORMAL LOW (ref 4.22–5.81)

## 2013-04-12 LAB — COMPREHENSIVE METABOLIC PANEL
Alkaline Phosphatase: 82 U/L (ref 39–117)
BUN: 7 mg/dL (ref 6–23)
Chloride: 101 mEq/L (ref 96–112)
GFR calc Af Amer: >90 mL/min (ref >90)
Glucose, Bld: 104 mg/dL — ABNORMAL HIGH (ref 70–99)
Potassium: 3.8 mEq/L (ref 3.5–5.1)
Total Bilirubin: 0.5 mg/dL (ref 0.3–1.2)

## 2013-04-12 NOTE — Progress Notes (Signed)
Pt denies SOB, chest pain, and being under the care of a cardiologist. Pt denies having a stress test, echo, and cardiac cath. 

## 2013-04-12 NOTE — Pre-Procedure Instructions (Signed)
JAYSE HODKINSON  04/12/2013   Your procedure is scheduled on: Tuesday, April 17, 2013  Report to [facility] at 5:30 AM.  Call this number if you have problems the morning of surgery: [phone]   Remember:   Do not eat food or drink liquids after midnight.   Take these medicines the morning of surgery with A SIP OF WATER: allopurinol (ZYLOPRIM) 300 MG tablet  Stop taking Aspirin and herbal medications. Do not take any NSAIDs ie: Ibuprofen, Advil, Naproxen or any medication  containing Aspirin.    Do not wear jewelry, make-up or nail polish.  Do not wear lotions, powders, or perfumes. You may wear deodorant.  Do not shave 48 hours prior to surgery. Men may shave face and neck.  Do not bring valuables to the hospital.  [facility] is not responsible for any belongings or valuables.  Contacts, dentures or bridgework may not be worn into surgery.  Leave suitcase in the car. After surgery it may be brought to your room.  For patients admitted to the hospital, checkout time is 11:00 AM the day of discharge.   Patients discharged the day of surgery will not be allowed to drive home.  Name and phone number of your driver:   Special Instructions: Shower using CHG 2 nights before surgery and the night before surgery.  If you shower the day of surgery use CHG.  Use special wash - you have one bottle of CHG for all showers.  You should use approximately 1/3 of the bottle for each shower.   Please read over the following fact sheets that you were given: Pain Booklet, Coughing and Deep Breathing and Surgical Site Infection Prevention

## 2013-04-16 MED ORDER — CEFAZOLIN SODIUM-DEXTROSE 2-3 GM-% IV SOLR
2.0000 g | INTRAVENOUS | Status: AC
  Administered 2013-04-17: 2 g via INTRAVENOUS
  Filled 2013-04-16: qty 50

## 2013-04-17 ENCOUNTER — Ambulatory Visit (HOSPITAL_COMMUNITY): Payer: 59 | Admitting: Certified Registered"

## 2013-04-17 ENCOUNTER — Ambulatory Visit (HOSPITAL_COMMUNITY): Payer: 59

## 2013-04-17 ENCOUNTER — Encounter (HOSPITAL_COMMUNITY)
Admission: RE | Disposition: A | Discharge status: Home / Self Care | Payer: Self-pay | Source: Ambulatory Visit | Attending: Surgery

## 2013-04-17 ENCOUNTER — Encounter (HOSPITAL_COMMUNITY): Payer: Self-pay | Admitting: *Deleted

## 2013-04-17 ENCOUNTER — Ambulatory Visit (HOSPITAL_COMMUNITY)
Admission: RE | Admit: 2013-04-17 | Discharge: 2013-04-17 | Disposition: A | Discharge status: Home / Self Care | Payer: 59 | Source: Ambulatory Visit | Attending: Surgery | Admitting: Surgery

## 2013-04-17 ENCOUNTER — Encounter (HOSPITAL_COMMUNITY): Payer: Self-pay | Admitting: Certified Registered"

## 2013-04-17 DIAGNOSIS — L408 Other psoriasis: Secondary | ICD-10-CM | POA: Insufficient documentation

## 2013-04-17 DIAGNOSIS — K4090 Unilateral inguinal hernia, without obstruction or gangrene, not specified as recurrent: Secondary | ICD-10-CM | POA: Insufficient documentation

## 2013-04-17 DIAGNOSIS — E781 Pure hyperglyceridemia: Secondary | ICD-10-CM | POA: Insufficient documentation

## 2013-04-17 DIAGNOSIS — K7689 Other specified diseases of liver: Secondary | ICD-10-CM | POA: Insufficient documentation

## 2013-04-17 DIAGNOSIS — K8020 Calculus of gallbladder without cholecystitis without obstruction: Secondary | ICD-10-CM | POA: Insufficient documentation

## 2013-04-17 DIAGNOSIS — K811 Chronic cholecystitis: Secondary | ICD-10-CM

## 2013-04-17 HISTORY — PX: CHOLECYSTECTOMY: SHX55

## 2013-04-17 SURGERY — LAPAROSCOPIC CHOLECYSTECTOMY WITH INTRAOPERATIVE CHOLANGIOGRAM
Anesthesia: General | Site: Abdomen | Wound class: Clean Contaminated

## 2013-04-17 MED ORDER — OXYCODONE HCL 5 MG PO TABS
ORAL_TABLET | ORAL | Status: AC
  Filled 2013-04-17: qty 1

## 2013-04-17 MED ORDER — CHLORHEXIDINE GLUCONATE 4 % EX LIQD: 1.0000 "application " | Freq: Once | CUTANEOUS | Status: DC

## 2013-04-17 MED ORDER — PROPOFOL 10 MG/ML IV BOLUS
INTRAVENOUS | Status: DC | PRN
  Administered 2013-04-17: 200 mg via INTRAVENOUS

## 2013-04-17 MED ORDER — BUPIVACAINE HCL 0.25 % IJ SOLN
INTRAMUSCULAR | Status: DC | PRN
  Administered 2013-04-17: 30 mL

## 2013-04-17 MED ORDER — 0.9 % SODIUM CHLORIDE (POUR BTL) OPTIME
TOPICAL | Status: DC | PRN
  Administered 2013-04-17: 1000 mL

## 2013-04-17 MED ORDER — HYDROMORPHONE HCL PF 1 MG/ML IJ SOLN
INTRAMUSCULAR | Status: AC
  Filled 2013-04-17: qty 1

## 2013-04-17 MED ORDER — OXYCODONE HCL 5 MG PO TABS
5.0000 mg | ORAL_TABLET | Freq: Once | ORAL | Status: AC | PRN
  Administered 2013-04-17: 5 mg via ORAL

## 2013-04-17 MED ORDER — SODIUM CHLORIDE 0.9 % IR SOLN
Status: DC | PRN
  Administered 2013-04-17 (×3): 1

## 2013-04-17 MED ORDER — ONDANSETRON HCL 4 MG/2ML IJ SOLN: 4.0000 mg | Freq: Four times a day (QID) | INTRAMUSCULAR | Status: DC | PRN

## 2013-04-17 MED ORDER — ROCURONIUM BROMIDE 100 MG/10ML IV SOLN
INTRAVENOUS | Status: DC | PRN
  Administered 2013-04-17: 50 mg via INTRAVENOUS
  Administered 2013-04-17: 20 mg via INTRAVENOUS

## 2013-04-17 MED ORDER — KETOROLAC TROMETHAMINE 15 MG/ML IJ SOLN: 15.0000 mg | Freq: Once | INTRAMUSCULAR | Status: DC

## 2013-04-17 MED ORDER — SODIUM CHLORIDE 0.9 % IV SOLN
INTRAVENOUS | Status: DC | PRN
  Administered 2013-04-17: 09:00:00

## 2013-04-17 MED ORDER — HYDROCODONE-ACETAMINOPHEN 5-325 MG PO TABS: 1.0000 | ORAL_TABLET | Freq: Four times a day (QID) | ORAL | Status: DC | PRN

## 2013-04-17 MED ORDER — OXYCODONE HCL 5 MG/5ML PO SOLN: 5.0000 mg | Freq: Once | ORAL | Status: AC | PRN

## 2013-04-17 MED ORDER — KETOROLAC TROMETHAMINE 30 MG/ML IJ SOLN
INTRAMUSCULAR | Status: AC
  Administered 2013-04-17: 15 mg
  Filled 2013-04-17: qty 1

## 2013-04-17 MED ORDER — HYDROMORPHONE HCL PF 1 MG/ML IJ SOLN
0.2500 mg | INTRAMUSCULAR | Status: DC | PRN
  Administered 2013-04-17 (×4): 0.5 mg via INTRAVENOUS

## 2013-04-17 MED ORDER — LACTATED RINGERS IV SOLN
INTRAVENOUS | Status: DC | PRN
  Administered 2013-04-17 (×2): via INTRAVENOUS

## 2013-04-17 MED ORDER — GLYCOPYRROLATE 0.2 MG/ML IJ SOLN
INTRAMUSCULAR | Status: DC | PRN
  Administered 2013-04-17: .5 mg via INTRAVENOUS

## 2013-04-17 MED ORDER — LIDOCAINE HCL (CARDIAC) 20 MG/ML IV SOLN
INTRAVENOUS | Status: DC | PRN
  Administered 2013-04-17: 70 mg via INTRAVENOUS

## 2013-04-17 MED ORDER — SUFENTANIL CITRATE 50 MCG/ML IV SOLN
INTRAVENOUS | Status: DC | PRN
  Administered 2013-04-17: 20 ug via INTRAVENOUS
  Administered 2013-04-17 (×2): 10 ug via INTRAVENOUS

## 2013-04-17 MED ORDER — EPHEDRINE SULFATE 50 MG/ML IJ SOLN
INTRAMUSCULAR | Status: DC | PRN
  Administered 2013-04-17: 10 mg via INTRAVENOUS

## 2013-04-17 MED ORDER — MIDAZOLAM HCL 5 MG/5ML IJ SOLN
INTRAMUSCULAR | Status: DC | PRN
  Administered 2013-04-17: 2 mg via INTRAVENOUS

## 2013-04-17 MED ORDER — NEOSTIGMINE METHYLSULFATE 1 MG/ML IJ SOLN
INTRAMUSCULAR | Status: DC | PRN
  Administered 2013-04-17: 3 mg via INTRAVENOUS

## 2013-04-17 MED ORDER — ONDANSETRON HCL 4 MG/2ML IJ SOLN
INTRAMUSCULAR | Status: DC | PRN
  Administered 2013-04-17: 4 mg via INTRAVENOUS

## 2013-04-17 MED ORDER — MIDAZOLAM HCL 2 MG/2ML IJ SOLN
INTRAMUSCULAR | Status: AC
  Administered 2013-04-17: 2 mg
  Filled 2013-04-17: qty 2

## 2013-04-17 SURGICAL SUPPLY — 50 items
APPLIER CLIP 5 13 M/L LIGAMAX5 (MISCELLANEOUS) ×4
APPLIER CLIP ROT 10 11.4 M/L (STAPLE)
BLADE SURG ROTATE 9660 (MISCELLANEOUS) ×2 IMPLANT
CANISTER SUCTION 2500CC (MISCELLANEOUS) ×2 IMPLANT
CHLORAPREP W/TINT 26ML (MISCELLANEOUS) ×2 IMPLANT
CHOLANGIOGRAM CATH TAUT (CATHETERS) ×2 IMPLANT
CLIP APPLIE 5 13 M/L LIGAMAX5 (MISCELLANEOUS) ×2 IMPLANT
CLIP APPLIE ROT 10 11.4 M/L (STAPLE) IMPLANT
CLOTH BEACON ORANGE TIMEOUT ST (SAFETY) ×2 IMPLANT
COVER MAYO STAND STRL (DRAPES) ×2 IMPLANT
COVER SURGICAL LIGHT HANDLE (MISCELLANEOUS) ×2 IMPLANT
DECANTER SPIKE VIAL GLASS SM (MISCELLANEOUS) ×2 IMPLANT
DERMABOND ADVANCED (GAUZE/BANDAGES/DRESSINGS) ×1
DERMABOND ADVANCED .7 DNX12 (GAUZE/BANDAGES/DRESSINGS) ×1 IMPLANT
DRAPE C-ARM 42X72 X-RAY (DRAPES) ×2 IMPLANT
DRAPE UTILITY 15X26 W/TAPE STR (DRAPE) ×4 IMPLANT
ELECT REM PT RETURN 9FT ADLT (ELECTROSURGICAL) ×2
ELECTRODE REM PT RTRN 9FT ADLT (ELECTROSURGICAL) ×1 IMPLANT
ENDOLOOP SUT PDS II  0 18 (SUTURE) ×3
ENDOLOOP SUT PDS II 0 18 (SUTURE) ×3 IMPLANT
FILTER SMOKE EVAC LAPAROSHD (FILTER) ×2 IMPLANT
GLOVE BIO SURGEON STRL SZ7.5 (GLOVE) ×2 IMPLANT
GLOVE BIOGEL PI IND STRL 7.0 (GLOVE) ×1 IMPLANT
GLOVE BIOGEL PI IND STRL 7.5 (GLOVE) ×1 IMPLANT
GLOVE BIOGEL PI INDICATOR 7.0 (GLOVE) ×1
GLOVE BIOGEL PI INDICATOR 7.5 (GLOVE) ×1
GLOVE EUDERMIC 7 POWDERFREE (GLOVE) ×2 IMPLANT
GLOVE SURG SIGNA 7.5 PF LTX (GLOVE) ×2 IMPLANT
GLOVE SURG SS PI 7.0 STRL IVOR (GLOVE) ×2 IMPLANT
GOWN STRL NON-REIN LRG LVL3 (GOWN DISPOSABLE) ×2 IMPLANT
GOWN STRL REIN XL XLG (GOWN DISPOSABLE) ×6 IMPLANT
IV CATH 14GX2 1/4 (CATHETERS) ×2 IMPLANT
KIT BASIN OR (CUSTOM PROCEDURE TRAY) ×2 IMPLANT
KIT ROOM TURNOVER OR (KITS) ×2 IMPLANT
NS IRRIG 1000ML POUR BTL (IV SOLUTION) ×2 IMPLANT
PAD ARMBOARD 7.5X6 YLW CONV (MISCELLANEOUS) ×2 IMPLANT
POUCH SPECIMEN RETRIEVAL 10MM (ENDOMECHANICALS) ×2 IMPLANT
SCISSORS LAP 5X35 DISP (ENDOMECHANICALS) IMPLANT
SET IRRIG TUBING LAPAROSCOPIC (IRRIGATION / IRRIGATOR) ×2 IMPLANT
SPECIMEN JAR SMALL (MISCELLANEOUS) ×2 IMPLANT
STOPCOCK 4 WAY LG BORE MALE ST (IV SETS) ×2 IMPLANT
SUT VIC AB 5-0 PS2 18 (SUTURE) ×2 IMPLANT
TOWEL OR 17X24 6PK STRL BLUE (TOWEL DISPOSABLE) ×2 IMPLANT
TOWEL OR 17X26 10 PK STRL BLUE (TOWEL DISPOSABLE) ×2 IMPLANT
TRAY LAPAROSCOPIC (CUSTOM PROCEDURE TRAY) ×2 IMPLANT
TROCAR XCEL BLUNT TIP 100MML (ENDOMECHANICALS) ×2 IMPLANT
TROCAR XCEL NON-BLD 11X100MML (ENDOMECHANICALS) IMPLANT
TROCAR XCEL NON-BLD 5MMX100MML (ENDOMECHANICALS) ×4 IMPLANT
TUBING EXTENTION W/L.L. (IV SETS) ×2 IMPLANT
WATER STERILE IRR 1000ML POUR (IV SOLUTION) IMPLANT

## 2013-04-17 NOTE — Anesthesia Postprocedure Evaluation (Signed)
Anesthesia Post Note  Patient: James Giles  Procedure(s) Performed: Procedure(s) (LRB): LAPAROSCOPIC CHOLECYSTECTOMY WITH INTRAOPERATIVE CHOLANGIOGRAM (N/A)  Anesthesia type: General  Patient location: PACU  Post pain: Pain level controlled and Adequate analgesia  Post assessment: Post-op Vital signs reviewed, Patient's Cardiovascular Status Stable, Respiratory Function Stable, Patent Airway and Pain level controlled  Last Vitals:  Filed Vitals:   04/17/13 0945  BP: 161/90  Pulse: 72  Temp:   Resp: 20    Post vital signs: Reviewed and stable  Level of consciousness: awake, alert  and oriented  Complications: No apparent anesthesia complications

## 2013-04-17 NOTE — Anesthesia Procedure Notes (Signed)
Procedure Name: Intubation Date/Time: 04/17/2013 7:29 AM Performed by: Charm Barges, Emmett Bracknell R Pre-anesthesia Checklist: Patient identified, Emergency Drugs available, Suction available, Patient being monitored and Timeout performed Patient Re-evaluated:Patient Re-evaluated prior to inductionOxygen Delivery Method: Circle system utilized Preoxygenation: Pre-oxygenation with 100% oxygen Intubation Type: IV induction Ventilation: Mask ventilation without difficulty Laryngoscope Size: Mac and 4 Grade View: Grade II Tube type: Oral Tube size: 8.5 mm Number of attempts: 1 Airway Equipment and Method: Stylet Placement Confirmation: ETT inserted through vocal cords under direct vision,  positive ETCO2 and breath sounds checked- equal and bilateral Secured at: 24 cm Tube secured with: Tape Dental Injury: Teeth and Oropharynx as per pre-operative assessment

## 2013-04-17 NOTE — Preoperative (Signed)
Beta Blockers   Reason not to administer Beta Blockers:Not Applicable 

## 2013-04-17 NOTE — Progress Notes (Signed)
Pt states pain is 4 of 10 and is untolerable at at that level.  Spoke with Dr. Chaney Malling and received order for dose of toradol for patient, and administered

## 2013-04-17 NOTE — Anesthesia Preprocedure Evaluation (Addendum)
Anesthesia Evaluation  Patient identified by MRN, date of birth, ID band Patient awake    Reviewed: Allergy & Precautions, H&P , NPO status , Patient's Chart, lab work & pertinent test results  Airway Mallampati: I TM Distance: >3 FB Neck ROM: full    Dental  (+) Teeth Intact and Dental Advisory Given   Pulmonary former smoker,          Cardiovascular     Neuro/Psych    GI/Hepatic   Endo/Other    Renal/GU      Musculoskeletal   Abdominal   Peds  Hematology   Anesthesia Other Findings   Reproductive/Obstetrics                          Anesthesia Physical Anesthesia Plan  ASA: II  Anesthesia Plan: General   Post-op Pain Management:    Induction: Intravenous  Airway Management Planned: Oral ETT  Additional Equipment:   Intra-op Plan:   Post-operative Plan: Extubation in OR  Informed Consent: I have reviewed the patients History and Physical, chart, labs and discussed the procedure including the risks, benefits and alternatives for the proposed anesthesia with the patient or authorized representative who has indicated his/her understanding and acceptance.   Dental advisory given  Plan Discussed with: CRNA, Anesthesiologist and Surgeon  Anesthesia Plan Comments:        Anesthesia Quick Evaluation

## 2013-04-17 NOTE — H&P (View-Only) (Signed)
Re:   James Giles DOB:   09-11-1965 MRN:   [id number]  ASSESSMENT AND PLAN: 1.  Cholelithiasis, history of pancreatitis  I discussed with the patient the indications and risks of gall bladder surgery.  The primary risks of gall bladder surgery include, but are not limited to, bleeding, infection, common bile duct injury, and open surgery.  There is also the risk that the patient may have continued symptoms after surgery.  However, the likelihood of improvement in symptoms and return to the patient's normal status is good. We discussed the typical post-operative recovery course. I tried to answer the patient's questions.  I gave the patient literature about gall bladder surgery.  The patient understands that this may or may not improve the dilatation of his biliary or pancreatic duct.  He also understands that there are other possible causes of duct dilatation, but that biliary disease is the most likely.  2.  Progression of pancreatitic/biliary duct dilitation on MRI - 01/25/2013.  History of bile duct stricture.  Has seen Dr. Darrel Hoover in consultation. 3.  Subtle liver lesions on MRI  There was no further recommendation regarding these.  I gave the patient a copy of his MRI report. 4.  Hypertriglyceridemia  12/05/2012 - 653  5.  Psoriasis 6.  History of pancreatitis 7.  History of gout  Chief Complaint  Patient presents with  . New Evaluation    eval GB   REFERRING PHYSICIAN: TODD,JEFFREY ALLEN, MD  HISTORY OF PRESENT ILLNESS: James Giles is a 47 y.o. (DOB: [birth year])  white  male whose primary care physician is TODD,JEFFREY ALLEN, MD and comes to me today for gall bladder disease.  He comes by himself.  The patient had a bout of pancreatitis in February 2012 for unclear reasons.  He had an ERCP by Dr. Arlyce Dice on 08/27/2010 and stent placement.  By the patient's recollection, he had 3 ERCP's.  He had a MRCP 10/20/2010 which showed resolution of the biliary ductal dilatation and  decrease in the inflammatory changes around the pancreas. He was seen by Dr. 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  He did well until January 2014 when he had abdominal pain that he attributed to a "stomach bug".  There was an illness going around the office that he thought was the cause of his symptoms. This got better.  But he had an [place] on 12/22/2012 that showed dilatation of the common bile duct to 10 mm.  This lead to the MRCP on 01/25/2013 which showed:  1. Progression of pancreatic and biliary ductal dilatation since  10/20/2010. Concurrent soft tissue fullness and edema centered  about the junction of the pancreatic head and descending duodenum.  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 Underlying  pancreatic head or ampullary/descending duodenal neoplasm cannot  be excluded and further evaluation with ERCP should be considered.  Given the surrounding inflammation, complicating duodenitis or recurrent pancreatitis is suspected.   2. Cholelithiasis without evidence of choledocholithiasis.  3. Subtle liver lesions which appear new since the prior exam and demonstrate probable restricted diffusion. LFT's from 12/05/2012 showed a minimally elevated T Bili - 1.4 and AST - 40.  He saw Dr. Arlyce Dice again 02/21/2013 who thought that the duct dilatation could be caused by inflammation and Dr. Arlyce Dice suggested the surgery go first to remove the gall bladder as a source of trouble. He has recently been disease free.  No history of stomach disease.  No history of liver disease.  No history of colon disease.  No history of colonoscopy. His grandfather had gall bladder surgery.  Both of his parents had colon polyps.   Past Medical History  Diagnosis Date  . Seasonal allergies 08/25/2010  . Psoriasis   . Psoriasis   . Pancreatitis       Past Surgical History  Procedure Laterality Date  . Ercp        Current Outpatient Prescriptions  Medication Sig Dispense Refill  . allopurinol (ZYLOPRIM) 300 MG tablet Take 1 tablet (300 mg total) by mouth daily.  100 tablet  3  . calcipotriene-betamethasone (TACLONEX) ointment Apply 1 application topically daily.       No current facility-administered medications for this visit.     No Known Allergies  REVIEW OF SYSTEMS: Skin:  No history of rash.  No history of abnormal moles. Infection:  No history of hepatitis or HIV.  No history of MRSA. Neurologic:  No history of stroke.  No history of seizure.  No history of headaches. Cardiac:  No history of hypertension. No history of heart disease.  No history of prior cardiac catheterization.  No history of seeing a cardiologist. Pulmonary:  Does not smoke cigarettes.  No asthma or bronchitis.  No OSA/CPAP.  Endocrine:  No diabetes. No thyroid disease. Hypertriglyceridemia. Gastrointestinal:  See HPI. Urologic:  No history of kidney stones.  No history of bladder infections. Musculoskeletal:  No history of joint or back disease. Hematologic:  No bleeding disorder.  No history of anemia.  Not anticoagulated. Psycho-social:  The patient is oriented.   The patient has no obvious psychologic or social impairment to understanding our conversation and plan.  SOCIAL and FAMILY HISTORY: Divorced. Lives by self. Has 3 children Originally from [place], [place], and much of his family is still up there. 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  PHYSICAL EXAM: BP 130/88  Pulse 64  Resp 16  Ht 6' (1.829 m)  Wt 174 lb 3.2 oz (79.017 kg)  BMI 23.62 kg/m2  General: WN WM who is alert and generally healthy appearing.  HEENT: Normal. Pupils equal. Neck: Supple. No mass.  No thyroid mass. Lymph Nodes:  No supraclavicular or cervical nodes. Lungs: Clear to auscultation and symmetric breath sounds. Heart:  RRR. No murmur or rub.  Abdomen: Soft. No mass. No tenderness. No hernia. Normal bowel sounds.  No  abdominal scars. Rectal: Not done. Extremities:  Good strength and ROM  in upper and lower extremities. Neurologic:  Grossly intact to motor and sensory function. Psychiatric: Has normal mood and affect. Behavior is normal.   DATA REVIEWED: Epic notes and Dr. Marzetta Board notes.  Ovidio Kin, MD,  [facility] [address]  [address]   [place], [place]    [address] Phone:  [phone] FAX:  [phone]

## 2013-04-17 NOTE — Transfer of Care (Signed)
Immediate Anesthesia Transfer of Care Note  Patient: James Giles  Procedure(s) Performed: Procedure(s): LAPAROSCOPIC CHOLECYSTECTOMY WITH INTRAOPERATIVE CHOLANGIOGRAM (N/A)  Patient Location: PACU  Anesthesia Type:General  Level of Consciousness: awake, alert  and oriented  Airway & Oxygen Therapy: Patient Spontanous Breathing and Patient connected to nasal cannula oxygen  Post-op Assessment: Report given to PACU RN and Post -op Vital signs reviewed and stable  Post vital signs: Reviewed and stable  Complications: No apparent anesthesia complications

## 2013-04-17 NOTE — Op Note (Signed)
04/17/2013  9:14 AM  PATIENT:  James Giles, 47 y.o., male, MRN: [id number]  PREOP DIAGNOSIS:  cholelithiasis with CBD dilitaiton  POSTOP DIAGNOSIS:   Cholelithiasis, debris in common bile duct without obstruction, small direct left inguinal hernia  PROCEDURE:   Procedure(s): LAPAROSCOPIC CHOLECYSTECTOMY WITH INTRAOPERATIVE CHOLANGIOGRAM  SURGEON:   Ovidio Kin, M.D.  Threasa HeadsEdythe Lynn, M.D.  ANESTHESIA:   general  Anesthesiologist: Raiford Simmonds, MD CRNA: Shireen Quan, CRNA; Rogelia Boga, CRNA  General  ASA: @asa @  EBL:  minimal  ml  BLOOD ADMINISTERED: none  DRAINS: none   LOCAL MEDICATIONS USED:   30 cc 1/4% Marcaine  SPECIMEN:   Gall bladder  COUNTS CORRECT:  YES  INDICATIONS FOR PROCEDURE:  James Giles is a 47 y.o. (DOB: 12-Sep-1965) white  male whose primary care physician is TODD,JEFFREY Freida Busman, MD and comes for cholecystectomy.  The patient had had prior pancreatitis in 2012 for unclear reasons.  He had further abdominal trouble this summer which prompted a MRCP.  The MRCP showed cholelithiasis with some bilary/pancreatic duct dilatation.  He has seen Dr. Darrel Hoover, who suggested he have a lap chole first.   The indications and risks of the gall bladder surgery were explained to the patient.  The risks include, but are not limited to, infection, bleeding, common bile duct injury and open surgery.  SURGERY:  The patient was taken to room #1 at [facility].  The abdomen was prepped with chloroprep.  The patient was given 2 gm Ancef at the beginning of the operation.   A time out was held and the surgical checklist run.   An infraumbilical incision was made into the abdominal cavity.  A 12 mm Hasson trocar was inserted into the abdominal cavity through the infraumbilical incision and secured with a 0 Vicryl suture.  Three additional trocars were inserted: a 5 mm trocar in the sub-xiphoid location, a 5 mm trocar in the right mid subcostal  area, and a 5 mm trocar in the right lateral subcostal area.   The abdomen was explored and the liver, stomach, and bowel that could be seen were unremarkable.  The patient does have a small direct left inguinal hernia.   The gall bladder was identified, grasped, and rotated cephalad.  There was extensive chronic adhesions involving the bottom half of the gall bladder.  This was consistent with the prior pancreatitis.  The gall bladder itself was fairly thin walled. Disssection was carried down to the gall bladder/cystic duct junction and the cystic duct isolated.  A clip was placed on the gall bladder side of the cystic duct.   An intra-operative cholangiogram was shot.   The intra-operative cholangiogram was shot using a cut off Taut catheter placed through a 14 gauge angiocath in the RUQ.  The Taut catheter was inserted in the cut cystic duct and secured with an endoclip.  A cholangiogram was shot with 15 cc of 1/2 strength Omnipaque.  Using fluoroscopy, the cholangiogram showed the flow of contrast into a mildly dilated common bile duct, up the hepatic radicals, and into the duodenum.  There was some debris in the distal common bile duct, but not obvious stones.  There was no evidence of bile duct obstruction.  His preop LFT's showed a mildly elevated AST to 42.  The rest of his LFT's were normal.   The Taut catheter was removed.  The cystic duct was too large to endoclip , so I placed 2  PDS Endoloops around the cystic duct.  The cystic artery was identified and clipped.  The gall bladder was bluntly and sharpley dissected from the gall bladder bed.   After the gall bladder was removed from the liver, the gall bladder bed and [place] were inspected.  There was no bleeding or bile leak.  The gall bladder was placed in a endocatch bag and delivered through the umbilicus.  The abdomen was irrigated with 2,500 cc saline.   The trocars were then removed.  I infiltrated 30 cc of 1/4% Marcaine  into the incisions.  The umbilical port closed with a 0 Vicryl suture and the skin closed with 5-0 Monocryl.  The skin was painted with Dermabond.  The patient's sponge and needle count were correct.  The patient was transported to the RR in good condition.  The plan is for him to go home today.  Ovidio Kin, MD, [facility] Pager: [phone] Office phone:  [phone]

## 2013-04-17 NOTE — Interval H&P Note (Signed)
History and Physical Interval Note:  04/17/2013 7:19 AM  James Giles  has presented today for surgery, with the diagnosis of cholelithiasis with CBD dilitaiton  The various methods of treatment have been discussed with the patient and family.  Patient has a friend who will take him home.   After consideration of risks, benefits and other options for treatment, the patient has consented to  Procedure(s): LAPAROSCOPIC CHOLECYSTECTOMY WITH INTRAOPERATIVE CHOLANGIOGRAM (N/A) as a surgical intervention .    The patient's history has been reviewed, patient examined, no change in status, stable for surgery.  I have reviewed the patient's chart and labs.  Questions were answered to the patient's satisfaction.     James Giles H

## 2013-04-19 ENCOUNTER — Encounter (HOSPITAL_COMMUNITY): Payer: Self-pay | Admitting: Surgery

## 2013-05-02 ENCOUNTER — Telehealth (INDEPENDENT_AMBULATORY_CARE_PROVIDER_SITE_OTHER): Payer: Self-pay

## 2013-05-02 NOTE — Telephone Encounter (Signed)
P/O appt 05/03/13 @ 5:15 to call if this is not a good date and time

## 2013-05-03 ENCOUNTER — Ambulatory Visit (INDEPENDENT_AMBULATORY_CARE_PROVIDER_SITE_OTHER): Payer: 59 | Admitting: Surgery

## 2013-05-03 ENCOUNTER — Encounter (INDEPENDENT_AMBULATORY_CARE_PROVIDER_SITE_OTHER): Payer: Self-pay

## 2013-05-03 VITALS — BP 136/76 | HR 66 | Temp 98.0°F | Resp 18 | Ht 72.0 in | Wt 175.0 lb

## 2013-05-03 DIAGNOSIS — K8020 Calculus of gallbladder without cholecystitis without obstruction: Secondary | ICD-10-CM

## 2013-05-03 NOTE — Progress Notes (Signed)
Re:   DEMARIS LEAVELL DOB:   1966-03-11 MRN:   [id number]  ASSESSMENT AND PLAN: 1.  Cholelithiasis, history of pancreatitis  Lap chole with IOC - 04/17/2013 - D. Ezzard Standing  He had trouble before sugery drinking liquids, but this has resolved.  He is very happy with his results from surgery.  I discussed the cholangiogram, which questioned sludge in his CBD, but since he is asymptomatic, I would do nothing further at this time.  His return appt is prn.   2.  Progression of pancreatitic/biliary duct dilitation on MRI - 01/25/2013.  History of bile duct stricture.  Has seen Dr. Darrel Hoover in consultation. 3.  Subtle liver lesions on MRI  There was no further recommendation regarding these.  I gave the patient a copy of his MRI report. 4.  Hypertriglyceridemia  12/05/2012 - 653  5.  Psoriasis 6.  History of pancreatitis 7.  History of gout  Chief Complaint  Patient presents with  . Routine Post Op    G.B   REFERRING PHYSICIAN: TODD,JEFFREY ALLEN, MD  HISTORY OF PRESENT ILLNESS: ANTIONIO NEGRON is a 47 y.o. (DOB: [birth year])  white  male whose primary care physician is TODD,JEFFREY ALLEN, MD and comes to me today for gall bladder disease.  He comes by himself.  He has done very well with his surgery.  He has no complaints.  History of biliary disease: The patient had a bout of pancreatitis in February 2012 for unclear reasons.  He had an ERCP by Dr. Arlyce Dice on 08/27/2010 and stent placement.  By the patient's recollection, he had 3 ERCP's.  He had a MRCP 10/20/2010 which showed resolution of the biliary ductal dilatation and decrease in the inflammatory changes around the pancreas. He was seen by Dr. 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  He did well until January 2014 when he had abdominal pain that he attributed to a "stomach bug".  There was an illness going around the office that he thought was the cause of his symptoms. This got better.  But he had an [place] on  12/22/2012 that showed dilatation of the common bile duct to 10 mm.  This lead to the MRCP on 01/25/2013 which showed:  1. Progression of pancreatic and biliary ductal dilatation since  10/20/2010. Concurrent soft tissue fullness and edema centered  about the junction of the pancreatic head and descending duodenum.  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 Underlying  pancreatic head or ampullary/descending duodenal neoplasm cannot  be excluded and further evaluation with ERCP should be considered.  Given the surrounding inflammation, complicating duodenitis or recurrent pancreatitis is suspected.   2. Cholelithiasis without evidence of choledocholithiasis.  3. Subtle liver lesions which appear new since the prior exam and demonstrate probable restricted diffusion. LFT's from 12/05/2012 showed a minimally elevated T Bili - 1.4 and AST - 40.  He saw Dr. Arlyce Dice again 02/21/2013 who thought that the duct dilatation could be caused by inflammation and Dr. Arlyce Dice suggested the surgery go first to remove the gall bladder as a source of trouble. He has recently been disease free.  No history of stomach disease.  No history of liver disease.  No history of colon disease.  No history of colonoscopy. His grandfather had gall bladder surgery.  Both of his parents had colon polyps.   Past Medical History  Diagnosis Date  . Seasonal allergies 08/25/2010  .  Psoriasis   . Psoriasis   . Pancreatitis      Current Outpatient Prescriptions  Medication Sig Dispense Refill  . allopurinol (ZYLOPRIM) 300 MG tablet Take 1 tablet (300 mg total) by mouth daily.  100 tablet  3  . calcipotriene-betamethasone (TACLONEX) ointment Apply 1 application topically daily as needed. For psoriasis      . HYDROcodone-acetaminophen (NORCO/VICODIN) 5-325 MG per tablet Take 1-2 tablets by mouth every 6 (six) hours as needed for pain.  30 tablet  1   No current  facility-administered medications for this visit.     No Known Allergies  REVIEW OF SYSTEMS: Endocrine:  No diabetes. No thyroid disease. Hypertriglyceridemia.  SOCIAL and FAMILY HISTORY: Divorced. Lives by self. Has 3 children Originally from [place], [place], and much of his family is still up there. 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  PHYSICAL EXAM: BP 136/76  Pulse 66  Temp(Src) 98 F (36.7 C)  Resp 18  Ht 6' (1.829 m)  Wt 175 lb (79.379 kg)  BMI 23.73 kg/m2  General: WN WM who is alert and generally healthy appearing.  Abdomen: Soft. No mass. No tenderness. No hernia. Normal bowel sounds.  Incisions look good.  DATA REVIEWED: Pathology to patient.  Ovidio Kin, MD,  [facility] [address]  [address]   [place], [place]    [address] Phone:  [phone] FAX:  [phone]

## 2013-08-14 ENCOUNTER — Other Ambulatory Visit: Payer: Self-pay | Admitting: Family Medicine

## 2013-12-20 ENCOUNTER — Other Ambulatory Visit: Payer: Self-pay | Admitting: Family Medicine

## 2014-03-21 ENCOUNTER — Other Ambulatory Visit: Payer: Self-pay | Admitting: Family Medicine

## 2014-04-24 ENCOUNTER — Other Ambulatory Visit: Payer: Self-pay | Admitting: Family Medicine

## 2014-05-29 ENCOUNTER — Other Ambulatory Visit: Payer: Self-pay | Admitting: Family Medicine

## 2014-07-08 ENCOUNTER — Other Ambulatory Visit: Payer: Self-pay | Admitting: Family Medicine

## 2014-07-11 ENCOUNTER — Telehealth: Payer: Self-pay | Admitting: Family Medicine

## 2014-07-11 MED ORDER — PREDNISONE 20 MG PO TABS: ORAL_TABLET | ORAL | Status: DC

## 2014-07-11 MED ORDER — ALLOPURINOL 300 MG PO TABS: 300.0000 mg | ORAL_TABLET | Freq: Every day | ORAL | Status: DC

## 2014-07-11 NOTE — Telephone Encounter (Signed)
pt has made the needed CPE appt for refills, (Aug 20, 2014)  and needs allopurinol (ZYLOPRIM) 300 MG tablet predniSONE (DELTASONE) 20 MG tablet   (likes to keep on hand) [facility]/ [address]

## 2014-08-12 ENCOUNTER — Other Ambulatory Visit (INDEPENDENT_AMBULATORY_CARE_PROVIDER_SITE_OTHER): Payer: 59 | Admitting: *Deleted

## 2014-08-12 DIAGNOSIS — Z0000 Encounter for general adult medical examination without abnormal findings: Secondary | ICD-10-CM

## 2014-08-13 ENCOUNTER — Other Ambulatory Visit (INDEPENDENT_AMBULATORY_CARE_PROVIDER_SITE_OTHER): Payer: 59

## 2014-08-13 DIAGNOSIS — Z0000 Encounter for general adult medical examination without abnormal findings: Secondary | ICD-10-CM

## 2014-08-13 LAB — COMPREHENSIVE METABOLIC PANEL
ALBUMIN: 3.8 g/dL (ref 3.5–5.2)
ALK PHOS: 96 U/L (ref 39–117)
ALT: 59 U/L — ABNORMAL HIGH (ref 0–53)
AST: 86 U/L — ABNORMAL HIGH (ref 0–37)
BILIRUBIN TOTAL: 0.9 mg/dL (ref 0.2–1.2)
BUN: 7 mg/dL (ref 6–23)
CO2: 26 mEq/L (ref 19–32)
CREATININE: 0.84 mg/dL (ref 0.40–1.50)
Calcium: 9 mg/dL (ref 8.4–10.5)
Chloride: 104 mEq/L (ref 96–112)
GFR: 103.31 mL/min (ref >60.00)
Glucose, Bld: 112 mg/dL — ABNORMAL HIGH (ref 70–99)
Potassium: 4.2 mEq/L (ref 3.5–5.1)
SODIUM: 137 meq/L (ref 135–145)
Total Protein: 6.7 g/dL (ref 6.0–8.3)

## 2014-08-13 LAB — CBC WITH DIFFERENTIAL/PLATELET
Basophils Absolute: 0 10*3/uL (ref 0.0–0.1)
Basophils Relative: 0.4 % (ref 0.0–3.0)
EOS PCT: 1.3 % (ref 0.0–5.0)
Eosinophils Absolute: 0.1 10*3/uL (ref 0.0–0.7)
HCT: 45.3 % (ref 39.0–52.0)
Hemoglobin: 14.9 g/dL (ref 13.0–17.0)
Lymphocytes Relative: 9.5 % — ABNORMAL LOW (ref 12.0–46.0)
Lymphs Abs: 1 10*3/uL (ref 0.7–4.0)
MCHC: 32.9 g/dL (ref 30.0–36.0)
MCV: 102.4 fl — ABNORMAL HIGH (ref 78.0–100.0)
MONO ABS: 1 10*3/uL (ref 0.1–1.0)
MONOS PCT: 9.8 % (ref 3.0–12.0)
NEUTROS ABS: 7.9 10*3/uL — AB (ref 1.4–7.7)
Neutrophils Relative %: 79 % — ABNORMAL HIGH (ref 43.0–77.0)
PLATELETS: 332 10*3/uL (ref 150.0–400.0)
RBC: 4.43 Mil/uL (ref 4.22–5.81)
RDW: 14.4 % (ref 11.5–15.5)
WBC: 10 10*3/uL (ref 4.0–10.5)

## 2014-08-13 LAB — POCT URINALYSIS DIPSTICK
Bilirubin, UA: NEGATIVE
Blood, UA: NEGATIVE
Glucose, UA: NEGATIVE
KETONES UA: NEGATIVE
LEUKOCYTES UA: NEGATIVE
Nitrite, UA: NEGATIVE
Protein, UA: NEGATIVE
Spec Grav, UA: 1.025
Urobilinogen, UA: 0.2
pH, UA: 5.5

## 2014-08-13 LAB — TSH: TSH: 3.89 u[IU]/mL (ref 0.35–4.50)

## 2014-08-13 LAB — LIPID PANEL
CHOL/HDL RATIO: 2
Cholesterol: 157 mg/dL (ref 0–200)
HDL: 93.6 mg/dL (ref >39.00)
LDL CALC: 48 mg/dL (ref 0–99)
NONHDL: 63.4
Triglycerides: 76 mg/dL (ref 0.0–149.0)
VLDL: 15.2 mg/dL (ref 0.0–40.0)

## 2014-08-20 ENCOUNTER — Ambulatory Visit (INDEPENDENT_AMBULATORY_CARE_PROVIDER_SITE_OTHER): Payer: 59 | Admitting: Family Medicine

## 2014-08-20 ENCOUNTER — Encounter: Payer: Self-pay | Admitting: Family Medicine

## 2014-08-20 VITALS — BP 150/92 | Temp 98.6°F | Ht 70.0 in | Wt 183.0 lb

## 2014-08-20 DIAGNOSIS — M10079 Idiopathic gout, unspecified ankle and foot: Secondary | ICD-10-CM

## 2014-08-20 DIAGNOSIS — Z8371 Family history of colonic polyps: Secondary | ICD-10-CM

## 2014-08-20 DIAGNOSIS — R195 Other fecal abnormalities: Secondary | ICD-10-CM

## 2014-08-20 DIAGNOSIS — Z0000 Encounter for general adult medical examination without abnormal findings: Secondary | ICD-10-CM

## 2014-08-20 DIAGNOSIS — M109 Gout, unspecified: Secondary | ICD-10-CM

## 2014-08-20 NOTE — Patient Instructions (Signed)
Purchase a Omron pump up digital blood pressure cuff and check your blood pressure daily in the morning  Best prices [employer]  Return in 4 weeks for follow-up. Bring a record of all your blood pressure readings and the device  Milk of magnesia 2-4 ounces daily  We will set you up a consult in GI ASAP for evaluation of the blood in your stools.  Continue the allopurinol  Continue follow-up with Dr. Yetta BarreJones. I agree it may be time to talk to him that one of the new medications.

## 2014-08-20 NOTE — Progress Notes (Signed)
Pre visit review using our clinic review tool, if applicable. No additional management support is needed unless otherwise documented below in the visit note. 

## 2014-08-20 NOTE — Progress Notes (Signed)
   Subjective:    Patient ID: James Giles, male    DOB: 01/11/1966, 49 y.o.   MRN: [id number]  HPI James Giles is a 49 year old married male nonsmoker who comes in today for general physical examination  He has a history of gout and takes allopurinol 300 mg daily and no gouty attacks  He takes tacrolimus from Dr. Yetta BarreJones his dermatologist for chronic psoriasis.  He had his gallbladder out last year and he said this is a best he's felt.  Mother and father both have colon polyps advised to get a colonoscopy this year.  Mother also has hypertension. His blood pressure today 152/90 BP last fall when he saw Dr. Allene PyoNewman's general surgeon was normal.  Vaccinations up-to-date   Review of Systems  Constitutional: Negative.   HENT: Negative.   Eyes: Negative.   Respiratory: Negative.   Cardiovascular: Negative.   Gastrointestinal: Negative.   Endocrine: Negative.   Genitourinary: Negative.   Musculoskeletal: Negative.   Skin: Negative.   Allergic/Immunologic: Negative.   Neurological: Negative.   Hematological: Negative.   Psychiatric/Behavioral: Negative.        Objective:   Physical Exam  Constitutional: He is oriented to person, place, and time. He appears well-developed and well-nourished.  HENT:  Head: Normocephalic and atraumatic.  Right Ear: External ear normal.  Left Ear: External ear normal.  Nose: Nose normal.  Mouth/Throat: Oropharynx is clear and moist.  Eyes: Conjunctivae and EOM are normal. Pupils are equal, round, and reactive to light.  Neck: Normal range of motion. Neck supple. No JVD present. No tracheal deviation present. No thyromegaly present.  Cardiovascular: Normal rate, regular rhythm, normal heart sounds and intact distal pulses.  Exam reveals no gallop and no friction rub.   No murmur heard. Pulmonary/Chest: Effort normal and breath sounds normal. No stridor. No respiratory distress. He has no wheezes. He has no rales. He exhibits no tenderness.  Abdominal:  Soft. Bowel sounds are normal. He exhibits no distension and no mass. There is no tenderness. There is no rebound and no guarding.  Genitourinary: Prostate normal and penis normal. Guaiac positive stool. No penile tenderness.  Musculoskeletal: Normal range of motion. He exhibits no edema or tenderness.  Lymphadenopathy:    He has no cervical adenopathy.  Neurological: He is alert and oriented to person, place, and time. He has normal reflexes. No cranial nerve deficit. He exhibits normal muscle tone.  Skin: Skin is warm and dry. No rash noted. No erythema. No pallor.  Extensive psoriasis on his hips buttocks extending around his rectum. Says his bowel movements normal no bleeding that he seen and no constipation  Psychiatric: He has a normal mood and affect. His behavior is normal. Judgment and thought content normal.  Nursing note and vitals reviewed.         Assessment & Plan:  Healthy male  Psoriasis managed by Dr. Yetta BarreJones  History of gout continue allopurinol 3 mg daily  Positive stool guaiac with a family history of colon polyps mom and dad,,,,,, refer to GI for colonoscopy  Elevated blood pressure,,,,,,, BP check daily follow-up in 4 weeks

## 2014-09-17 ENCOUNTER — Other Ambulatory Visit: Payer: Self-pay | Admitting: Family Medicine

## 2014-09-26 ENCOUNTER — Telehealth: Payer: Self-pay | Admitting: Family Medicine

## 2014-09-26 ENCOUNTER — Ambulatory Visit (INDEPENDENT_AMBULATORY_CARE_PROVIDER_SITE_OTHER): Payer: 59 | Admitting: Family Medicine

## 2014-09-26 ENCOUNTER — Encounter: Payer: Self-pay | Admitting: Family Medicine

## 2014-09-26 VITALS — BP 150/100 | Temp 98.4°F | Wt 182.0 lb

## 2014-09-26 DIAGNOSIS — I10 Essential (primary) hypertension: Secondary | ICD-10-CM

## 2014-09-26 HISTORY — DX: Essential (primary) hypertension: I10

## 2014-09-26 MED ORDER — LOSARTAN POTASSIUM-HCTZ 50-12.5 MG PO TABS: 1.0000 | ORAL_TABLET | Freq: Every day | ORAL | Status: DC

## 2014-09-26 NOTE — Patient Instructions (Signed)
Hyzaar 50-12.5,,,,,,, 1 daily in the morning  Check your blood pressure daily in the morning  Return in 2 weeks for follow-up with the data and the device

## 2014-09-26 NOTE — Telephone Encounter (Signed)
emmi emailed °

## 2014-09-26 NOTE — Progress Notes (Signed)
   Subjective:    Patient ID: James Giles, male    DOB: 10/27/1965, 49 y.o.   MRN: [id number]  HPI James Giles is a 49 year old married male nonsmoker who comes in today for evaluation of elevated blood pressure we saw him a couple weeks ago with elevation of his blood pressure. He purchase a digital blood pressure cuff is been checking his blood pressure at home. All his blood pressures at home are elevated. His systolic range 07/26/1938 to to 160 diastolics 90-95.    He checked with his mother and father and both of them have hypertension.   Review of Systems    review of systems otherwise negative Objective:   Physical Exam Well-developed well-nourished male no acute distress vital signs stable he is afebrile BP right arm sitting position 150/100       Assessment & Plan:  New-onset hypertension,,,,, no salt diet,,,,, continue daily exercise,,,, begin Hyzaar 1 daily BP check every morning follow-up in

## 2014-09-26 NOTE — Progress Notes (Signed)
Pre visit review using our clinic review tool, if applicable. No additional management support is needed unless otherwise documented below in the visit note. 

## 2014-10-10 ENCOUNTER — Ambulatory Visit (INDEPENDENT_AMBULATORY_CARE_PROVIDER_SITE_OTHER): Payer: 59 | Admitting: Family Medicine

## 2014-10-10 ENCOUNTER — Encounter: Payer: Self-pay | Admitting: Family Medicine

## 2014-10-10 VITALS — BP 110/84 | Temp 98.1°F | Wt 177.0 lb

## 2014-10-10 DIAGNOSIS — I10 Essential (primary) hypertension: Secondary | ICD-10-CM

## 2014-10-10 NOTE — Progress Notes (Signed)
   Subjective:    Patient ID: James Giles, male    DOB: 08/24/1965, 49 y.o.   MRN: [id number]  HPI  Selena BattenKim is a 49 year old male comes and a for follow-up of hypertension  We started him on Hyzaar 50-12 0.5 daily BP now normal 110/84.  No side effects from medication  Review of Systems Review of systems otherwise negative    Objective:   Physical Exam  Well-developed well-nourished male no acute distress vital signs stable he is afebrile BP right arm sitting position 110/84      Assessment & Plan:  Hypertension at goal.....Marland Kitchen. continue current therapy....... check BP weekly..... Return when necessary

## 2014-10-10 NOTE — Patient Instructions (Signed)
Check your blood pressure weekly  BP goal 135/85 or less  Take her medication daily  Return in January 2017 for your annual exam sooner if any problems  Rachel's extension is [phone]  [facility] NAFZINGER

## 2014-10-10 NOTE — Progress Notes (Signed)
Pre visit review using our clinic review tool, if applicable. No additional management support is needed unless otherwise documented below in the visit note. 

## 2014-11-13 ENCOUNTER — Ambulatory Visit (INDEPENDENT_AMBULATORY_CARE_PROVIDER_SITE_OTHER): Payer: 59 | Admitting: Gastroenterology

## 2014-11-13 ENCOUNTER — Encounter: Payer: Self-pay | Admitting: Gastroenterology

## 2014-11-13 VITALS — BP 132/86 | HR 100 | Ht 70.25 in | Wt 173.1 lb

## 2014-11-13 DIAGNOSIS — K921 Melena: Secondary | ICD-10-CM | POA: Insufficient documentation

## 2014-11-13 NOTE — Patient Instructions (Signed)
It has been recommended to you by your physician that you have a(n) Colonoscopy completed. Per your request, we did not schedule the procedure(s) today. Please contact our office at [phone] should you decide to have the procedure completed.  You will be scheduled for a Pre-op appointment about a week to two weeks before your procedure when you call back to schedule  Cc. Tinnie GensJeffrey Todd,MD

## 2014-11-13 NOTE — Progress Notes (Signed)
_                                                                                                                History of Present Illness:  [name] is a pleasant 49 year old white male referred at the request of Dr. Tawanna Giles for evaluation of Hemoccult-positive stool.  This was noted on routine testing.  He has no GI complaints including rectal bleeding, change in bowel habits or abdominal pain.  Recent hemoglobin was normal.  Family history is pertinent for both parents who had polyps.  An aunt had colon cancer.  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  He eventually he underwent a cholecystectomy and has felt fine.  Her tests in January, 2016 were normal except for AST 86.   Past Medical History  Diagnosis Date  . Seasonal allergies 08/25/2010  . Psoriasis   . Psoriasis   . Pancreatitis   . Essential hypertension, benign 09/26/2014   Past Surgical History  Procedure Laterality Date  . Ercp    . Cholecystectomy N/A 04/17/2013    Procedure: LAPAROSCOPIC CHOLECYSTECTOMY WITH INTRAOPERATIVE CHOLANGIOGRAM;  Surgeon: Kandis Cockingavid H Newman, MD;  Location: [facility] OR;  Service: General;  Laterality: N/A;   family history includes Colon cancer in his maternal aunt; Colon polyps in his father and mother; Diabetes in his maternal grandmother; Hypertension in his mother; Prostate cancer in his father. Current Outpatient Prescriptions  Medication Sig Dispense Refill  . allopurinol (ZYLOPRIM) 300 MG tablet TAKE ONE TABLET BY MOUTH ONE TIME DAILY  30 tablet 11  . calcipotriene-betamethasone (TACLONEX) ointment Apply 1 application topically daily as needed. For psoriasis    . losartan-hydrochlorothiazide (HYZAAR) 50-12.5 MG per tablet Take 1 tablet by mouth daily. 100 tablet 3   No current facility-administered medications for this visit.   Allergies as of 11/13/2014  . (No Known Allergies)    reports that he has quit smoking.  His smoking use included Cigars. He has never used smokeless tobacco. He reports that he drinks about 8.4 oz of alcohol per week. He reports that he does not use illicit drugs.   Review of Systems: Pertinent positive and negative review of systems were noted in the above HPI section. All other review of systems were otherwise negative.  Vital signs were reviewed in today's medical record Physical Exam: General: Well developed , well nourished, no acute distress Skin: anicteric Head: Normocephalic and atraumatic Eyes:  sclerae anicteric, EOMI Ears: Normal auditory acuity Mouth: No deformity or lesions Neck: Supple, no masses or thyromegaly Lungs: Clear throughout to auscultation Heart: Regular rate and rhythm; no murmurs, rubs or bruits Abdomen: Soft, non tender and non distended. No masses, hepatosplenomegaly or hernias noted. Normal Bowel sounds Rectal:deferred Musculoskeletal: Symmetrical with no gross deformities  Skin: No lesions on visible extremities Pulses:  Normal pulses noted Extremities: No clubbing, cyanosis, edema or deformities noted Neurological: Alert oriented x 4, grossly nonfocal Cervical Nodes:  No significant cervical adenopathy Inguinal Nodes: No  significant inguinal adenopathy Psychological:  Alert and cooperative. Normal mood and affect  See Assessment and Plan under Problem List

## 2014-11-13 NOTE — Assessment & Plan Note (Signed)
Heme-positive stool noted on routine exam.  Normal hemoglobin.  Recommendations #1 colonoscopy

## 2015-03-05 ENCOUNTER — Other Ambulatory Visit: Payer: Self-pay | Admitting: Family Medicine

## 2015-07-12 ENCOUNTER — Other Ambulatory Visit: Payer: Self-pay | Admitting: Family Medicine

## 2015-08-04 ENCOUNTER — Other Ambulatory Visit: Payer: Self-pay | Admitting: Family Medicine

## 2015-08-18 ENCOUNTER — Other Ambulatory Visit: Payer: 59

## 2015-08-25 ENCOUNTER — Encounter: Payer: 59 | Admitting: Family Medicine

## 2015-09-03 ENCOUNTER — Other Ambulatory Visit: Payer: Self-pay | Admitting: Family Medicine

## 2015-09-07 ENCOUNTER — Other Ambulatory Visit: Payer: Self-pay | Admitting: Family Medicine

## 2015-09-25 ENCOUNTER — Other Ambulatory Visit: Payer: Self-pay | Admitting: Family Medicine

## 2015-11-18 ENCOUNTER — Other Ambulatory Visit: Payer: Self-pay | Admitting: Family Medicine

## 2015-12-08 ENCOUNTER — Other Ambulatory Visit: Payer: Self-pay | Admitting: Family Medicine

## 2016-03-31 ENCOUNTER — Encounter: Payer: 59 | Admitting: Family Medicine

## 2016-04-01 ENCOUNTER — Other Ambulatory Visit (INDEPENDENT_AMBULATORY_CARE_PROVIDER_SITE_OTHER): Payer: Managed Care, Other (non HMO)

## 2016-04-01 DIAGNOSIS — Z0000 Encounter for general adult medical examination without abnormal findings: Secondary | ICD-10-CM | POA: Diagnosis not present

## 2016-04-01 DIAGNOSIS — R7989 Other specified abnormal findings of blood chemistry: Secondary | ICD-10-CM | POA: Diagnosis not present

## 2016-04-01 LAB — LIPID PANEL
CHOLESTEROL: 160 mg/dL (ref 0–200)
HDL: 54.1 mg/dL (ref >39.00)
Total CHOL/HDL Ratio: 3
Triglycerides: 460 mg/dL — ABNORMAL HIGH (ref 0.0–149.0)

## 2016-04-01 LAB — CBC WITH DIFFERENTIAL/PLATELET
BASOS ABS: 0 10*3/uL (ref 0.0–0.1)
Basophils Relative: 0.7 % (ref 0.0–3.0)
EOS PCT: 2.3 % (ref 0.0–5.0)
Eosinophils Absolute: 0.1 10*3/uL (ref 0.0–0.7)
HCT: 39.7 % (ref 39.0–52.0)
HEMOGLOBIN: 13.6 g/dL (ref 13.0–17.0)
Lymphocytes Relative: 12.3 % (ref 12.0–46.0)
Lymphs Abs: 0.6 10*3/uL — ABNORMAL LOW (ref 0.7–4.0)
MCHC: 34.4 g/dL (ref 30.0–36.0)
MCV: 95.5 fl (ref 78.0–100.0)
MONO ABS: 0.6 10*3/uL (ref 0.1–1.0)
MONOS PCT: 12 % (ref 3.0–12.0)
Neutro Abs: 3.4 10*3/uL (ref 1.4–7.7)
Neutrophils Relative %: 72.7 % (ref 43.0–77.0)
Platelets: 281 10*3/uL (ref 150.0–400.0)
RBC: 4.16 Mil/uL — AB (ref 4.22–5.81)
RDW: 13.4 % (ref 11.5–15.5)
WBC: 4.7 10*3/uL (ref 4.0–10.5)

## 2016-04-01 LAB — HEPATIC FUNCTION PANEL
ALK PHOS: 56 U/L (ref 39–117)
ALT: 35 U/L (ref 0–53)
AST: 50 U/L — AB (ref 0–37)
Albumin: 3.8 g/dL (ref 3.5–5.2)
BILIRUBIN TOTAL: 0.5 mg/dL (ref 0.2–1.2)
Bilirubin, Direct: 0.1 mg/dL (ref 0.0–0.3)
Total Protein: 6.8 g/dL (ref 6.0–8.3)

## 2016-04-01 LAB — POC URINALSYSI DIPSTICK (AUTOMATED)
Bilirubin, UA: NEGATIVE
Glucose, UA: NEGATIVE
Ketones, UA: NEGATIVE
LEUKOCYTES UA: NEGATIVE
NITRITE UA: NEGATIVE
PH UA: 5
PROTEIN UA: NEGATIVE
RBC UA: NEGATIVE
Spec Grav, UA: 1.015
UROBILINOGEN UA: 0.2

## 2016-04-01 LAB — BASIC METABOLIC PANEL
BUN: 13 mg/dL (ref 6–23)
CALCIUM: 8.6 mg/dL (ref 8.4–10.5)
CO2: 28 mEq/L (ref 19–32)
Chloride: 104 mEq/L (ref 96–112)
Creatinine, Ser: 0.82 mg/dL (ref 0.40–1.50)
GFR: 105.52 mL/min (ref >60.00)
Glucose, Bld: 94 mg/dL (ref 70–99)
POTASSIUM: 4.1 meq/L (ref 3.5–5.1)
SODIUM: 138 meq/L (ref 135–145)

## 2016-04-01 LAB — TSH: TSH: 4.24 u[IU]/mL (ref 0.35–4.50)

## 2016-04-01 LAB — LDL CHOLESTEROL, DIRECT: LDL DIRECT: 51 mg/dL

## 2016-04-01 LAB — PSA: PSA: 2.17 ng/mL (ref 0.10–4.00)

## 2016-04-07 ENCOUNTER — Ambulatory Visit (INDEPENDENT_AMBULATORY_CARE_PROVIDER_SITE_OTHER): Payer: Managed Care, Other (non HMO) | Admitting: Family Medicine

## 2016-04-07 ENCOUNTER — Encounter: Payer: Self-pay | Admitting: Family Medicine

## 2016-04-07 VITALS — BP 122/78 | HR 80 | Temp 97.9°F | Ht 70.0 in | Wt 174.1 lb

## 2016-04-07 DIAGNOSIS — Z0000 Encounter for general adult medical examination without abnormal findings: Secondary | ICD-10-CM

## 2016-04-07 DIAGNOSIS — Z8042 Family history of malignant neoplasm of prostate: Secondary | ICD-10-CM

## 2016-04-07 DIAGNOSIS — I10 Essential (primary) hypertension: Secondary | ICD-10-CM | POA: Diagnosis not present

## 2016-04-07 DIAGNOSIS — L409 Psoriasis, unspecified: Secondary | ICD-10-CM

## 2016-04-07 DIAGNOSIS — Z83719 Family history of colon polyps, unspecified: Secondary | ICD-10-CM

## 2016-04-07 DIAGNOSIS — Z8371 Family history of colonic polyps: Secondary | ICD-10-CM

## 2016-04-07 MED ORDER — PREDNISONE 20 MG PO TABS: ORAL_TABLET | ORAL | 2 refills | Status: AC

## 2016-04-07 MED ORDER — ALLOPURINOL 300 MG PO TABS: 300.0000 mg | ORAL_TABLET | Freq: Every day | ORAL | 5 refills | Status: DC

## 2016-04-07 NOTE — Progress Notes (Signed)
Pre visit review using our clinic review tool, if applicable. No additional management support is needed unless otherwise documented below in the visit note. 

## 2016-04-07 NOTE — Progress Notes (Signed)
James Giles  is a 50 year old divorced male nonsmoker who comes in today for general physical examination  He has a history of rather severe psoriasis. He uses a combination of Dovonex and betamethasone via his dermatologist. Occasion take a short course of prednisone when it flares up.  He also takes L. All 3 or milligrams daily prevent gout. He stopped his blood pressure medicine 6 months ago. He begin a diet and exercise program. BP has remained normal. BP today 122/78. Both his mother and father have high blood pressure  He gets routine eye care, dental care, due for colonoscopy this year.  Vaccinations up-to-date declines a flu shot  Social history he works for [employer]. 2 children in college 825 year old. He and his ex-wife share custody of the 25 year old. [place] to [place] to [place] where he's content to get married to an old college friend next year  Review of systems otherwise negative except positive family history of prostate cancer both his father and grandfather.  Physical examination  Vital signs stable he is afebrile examination the HEENT were negative except he wears contacts neck was supple thyroid is not enlarged no carotid bruits cardiopulmonary exam normal abdominal exam normal genitalia normal circumcised male rectum normal. Guaiac negative prostate normal extremities normal skin normal peripheral pulses normal except he has rather extensive psoriasis mainly now on his back and hips.  Impression healthy male  High blood pressure control with diet exercise and weight loss. Continue monitoring blood pressure because of the positive genetic history  #2 psoriasis....... continue current therapy  #3 positive family history of prostate cancer....... discussed the pluses and minuses prostate screening patient would like to have that done.

## 2016-04-07 NOTE — Patient Instructions (Signed)
Continue good diet exercise and health habits  Monitor your blood pressure weekly to be sure it stays norma..........Marland Kitchen.l goal,,,,,,,,,,,,, 135/85 or less  Dr. Leodis Bineterry Withers........Marland Kitchen. primary care physician in [place]...........Marland Kitchen. or [facility]

## 2017-06-02 ENCOUNTER — Other Ambulatory Visit: Payer: Self-pay | Admitting: Family Medicine

## 2017-06-03 NOTE — Telephone Encounter (Signed)
Pt needs an office visit for more refills 

## 2017-06-07 ENCOUNTER — Other Ambulatory Visit: Payer: Self-pay | Admitting: Family Medicine
